# Patient Record
Sex: Female | Born: 1965 | Race: White | Hispanic: No | Marital: Married | Smoking: Never smoker
Health system: Southern US, Community
[De-identification: ages and names within clinical notes are randomized; demographics above are authoritative.]

## PROBLEM LIST (undated history)

## (undated) DIAGNOSIS — B2790 Infectious mononucleosis, unspecified without complication: Secondary | ICD-10-CM

## (undated) DIAGNOSIS — J302 Other seasonal allergic rhinitis: Secondary | ICD-10-CM

## (undated) DIAGNOSIS — B199 Unspecified viral hepatitis without hepatic coma: Secondary | ICD-10-CM

## (undated) DIAGNOSIS — E119 Type 2 diabetes mellitus without complications: Secondary | ICD-10-CM

## (undated) DIAGNOSIS — K219 Gastro-esophageal reflux disease without esophagitis: Secondary | ICD-10-CM

## (undated) DIAGNOSIS — D649 Anemia, unspecified: Secondary | ICD-10-CM

## (undated) DIAGNOSIS — F419 Anxiety disorder, unspecified: Secondary | ICD-10-CM

## (undated) DIAGNOSIS — T7840XA Allergy, unspecified, initial encounter: Secondary | ICD-10-CM

## (undated) DIAGNOSIS — G4730 Sleep apnea, unspecified: Secondary | ICD-10-CM

## (undated) DIAGNOSIS — M1990 Unspecified osteoarthritis, unspecified site: Secondary | ICD-10-CM

## (undated) HISTORY — DX: Anxiety disorder, unspecified: F41.9

## (undated) HISTORY — DX: Anemia, unspecified: D64.9

## (undated) HISTORY — PX: COLONOSCOPY: SHX174

## (undated) HISTORY — DX: Unspecified viral hepatitis without hepatic coma: B19.9

## (undated) HISTORY — DX: Allergy, unspecified, initial encounter: T78.40XA

## (undated) HISTORY — DX: Infectious mononucleosis, unspecified without complication: B27.90

## (undated) HISTORY — DX: Sleep apnea, unspecified: G47.30

## (undated) HISTORY — DX: Type 2 diabetes mellitus without complications: E11.9

---

## 1998-02-09 ENCOUNTER — Emergency Department (HOSPITAL_COMMUNITY): Admission: EM | Admit: 1998-02-09 | Discharge: 1998-02-10 | Payer: Self-pay | Admitting: Emergency Medicine

## 1999-01-27 ENCOUNTER — Encounter: Payer: Self-pay | Admitting: Obstetrics and Gynecology

## 1999-01-27 ENCOUNTER — Encounter: Admission: RE | Admit: 1999-01-27 | Discharge: 1999-01-27 | Payer: Self-pay | Admitting: Obstetrics and Gynecology

## 2001-08-17 ENCOUNTER — Ambulatory Visit (HOSPITAL_BASED_OUTPATIENT_CLINIC_OR_DEPARTMENT_OTHER): Admission: RE | Admit: 2001-08-17 | Discharge: 2001-08-17 | Payer: Self-pay | Admitting: Obstetrics and Gynecology

## 2002-03-15 HISTORY — PX: TUBAL LIGATION: SHX77

## 2004-06-09 ENCOUNTER — Ambulatory Visit (HOSPITAL_COMMUNITY): Admission: RE | Admit: 2004-06-09 | Discharge: 2004-06-09 | Payer: Self-pay | Admitting: Otolaryngology

## 2006-02-08 ENCOUNTER — Encounter: Admission: RE | Admit: 2006-02-08 | Discharge: 2006-02-08 | Payer: Self-pay | Admitting: Obstetrics and Gynecology

## 2007-05-02 ENCOUNTER — Encounter: Admission: RE | Admit: 2007-05-02 | Discharge: 2007-05-02 | Payer: Self-pay | Admitting: Obstetrics and Gynecology

## 2008-05-27 ENCOUNTER — Encounter: Admission: RE | Admit: 2008-05-27 | Discharge: 2008-05-27 | Payer: Self-pay | Admitting: Obstetrics and Gynecology

## 2008-06-07 ENCOUNTER — Encounter: Admission: RE | Admit: 2008-06-07 | Discharge: 2008-06-07 | Payer: Self-pay | Admitting: Obstetrics and Gynecology

## 2008-07-08 ENCOUNTER — Encounter (INDEPENDENT_AMBULATORY_CARE_PROVIDER_SITE_OTHER): Payer: Self-pay | Admitting: Obstetrics and Gynecology

## 2008-07-08 ENCOUNTER — Ambulatory Visit: Payer: Self-pay

## 2009-07-10 ENCOUNTER — Encounter: Admission: RE | Admit: 2009-07-10 | Discharge: 2009-07-10 | Payer: Self-pay | Admitting: Obstetrics and Gynecology

## 2010-07-31 NOTE — Op Note (Signed)
[facility]  Patient:    Mary [name], Mary [name] Visit Number: [id number] MRN: [id number]          Service Type: NES Location: [facility] Attending Physician:  Lendon Colonel Dictated by:   Kathie Rhodes. Kyra Manges, M.D. Proc. Date: 08/17/01 Admit Date:  08/17/2001                             Operative Report  PREOPERATIVE DIAGNOSIS:  Desires sterilization.  POSTOPERATIVE DIAGNOSIS:  Desires sterilization.  OPERATION:  Laparoscopic tubal ligation.  DESCRIPTION OF PROCEDURE:  The patient was placed in the lithotomy position and prepped and draped in the usual fashion.  A transverse incision was made in the umbilicus.  The patient was placed in the Trendelenburg position, and a Veress needle was inserted.  Aspiration and infusion.  I was not comfortable with the first placement.  Second placement with skin clips were utilized, and the Veress was inserted without difficulty.  Double click was heard. Aspiration infusion accomplished, and the gas was infused, about 3.2 liters of CO2.  The large trocar was inserted into the abdomen, and visualization of the pelvis revealed two normal tubes and ovaries.  The right tube was somewhat foreshortened, and there was a small adhesion over this tube.  This was lysed. The tubes were then cauterized 2 cm lateral to the uterotubal junction and scissored.  Hemostasis was secure.  No unusual abnormalities were noted in the liver.  No free fluid was noted within the abdomen, and the cul-de-sac was free from disease.  The gas was evacuated, and the skin was closed with a UR-6 needle and 0 Vicryl, following which I used 4-0 PDS to close the skin. Incision was infiltrated with 0.5% Marcaine with epinephrine, and the patient was taken to the recovery room in good condition. Dictated by:   S. Kyra Manges, M.D. Attending Physician:  Lendon Colonel DD:  08/17/01 TD:  08/18/01 Job: [id number]

## 2011-09-13 ENCOUNTER — Other Ambulatory Visit: Payer: Self-pay | Admitting: Obstetrics and Gynecology

## 2011-09-13 DIAGNOSIS — Z1231 Encounter for screening mammogram for malignant neoplasm of breast: Secondary | ICD-10-CM

## 2011-09-27 ENCOUNTER — Ambulatory Visit
Admission: RE | Admit: 2011-09-27 | Discharge: 2011-09-27 | Disposition: A | Discharge status: Home / Self Care | Payer: Private Health Insurance - Indemnity | Source: Ambulatory Visit | Attending: Obstetrics and Gynecology | Admitting: Obstetrics and Gynecology

## 2011-09-27 DIAGNOSIS — Z1231 Encounter for screening mammogram for malignant neoplasm of breast: Secondary | ICD-10-CM

## 2012-08-08 ENCOUNTER — Other Ambulatory Visit: Payer: Self-pay

## 2012-08-08 DIAGNOSIS — Z1231 Encounter for screening mammogram for malignant neoplasm of breast: Secondary | ICD-10-CM

## 2012-09-28 ENCOUNTER — Ambulatory Visit: Payer: Private Health Insurance - Indemnity

## 2012-10-26 ENCOUNTER — Ambulatory Visit: Payer: Private Health Insurance - Indemnity

## 2012-11-07 ENCOUNTER — Ambulatory Visit
Admission: RE | Admit: 2012-11-07 | Discharge: 2012-11-07 | Disposition: A | Discharge status: Home / Self Care | Payer: 59 | Source: Ambulatory Visit

## 2012-11-07 DIAGNOSIS — Z1231 Encounter for screening mammogram for malignant neoplasm of breast: Secondary | ICD-10-CM

## 2012-11-21 ENCOUNTER — Other Ambulatory Visit: Payer: Self-pay | Admitting: Radiology

## 2012-11-28 ENCOUNTER — Encounter (HOSPITAL_COMMUNITY): Payer: Self-pay

## 2012-11-28 ENCOUNTER — Encounter (HOSPITAL_COMMUNITY)
Admission: RE | Admit: 2012-11-28 | Discharge: 2012-11-28 | Disposition: A | Discharge status: Home / Self Care | Payer: 59 | Source: Ambulatory Visit | Attending: Orthopaedic Surgery | Admitting: Orthopaedic Surgery

## 2012-11-28 DIAGNOSIS — Z01812 Encounter for preprocedural laboratory examination: Secondary | ICD-10-CM | POA: Insufficient documentation

## 2012-11-28 HISTORY — DX: Gastro-esophageal reflux disease without esophagitis: K21.9

## 2012-11-28 HISTORY — DX: Unspecified osteoarthritis, unspecified site: M19.90

## 2012-11-28 HISTORY — DX: Other seasonal allergic rhinitis: J30.2

## 2012-11-28 LAB — CBC WITH DIFFERENTIAL/PLATELET
Basophils Relative: 0 % (ref 0–1)
Eosinophils Absolute: 0.1 10*3/uL (ref 0.0–0.7)
Lymphs Abs: 1.9 10*3/uL (ref 0.7–4.0)
MCH: 30.6 pg (ref 26.0–34.0)
Neutro Abs: 4.1 10*3/uL (ref 1.7–7.7)
Neutrophils Relative %: 61 % (ref 43–77)
Platelets: 279 10*3/uL (ref 150–400)
RBC: 4.35 MIL/uL (ref 3.87–5.11)

## 2012-11-28 LAB — URINALYSIS, ROUTINE W REFLEX MICROSCOPIC
Bilirubin Urine: NEGATIVE
Glucose, UA: NEGATIVE mg/dL
Ketones, ur: NEGATIVE mg/dL
pH: 7 (ref 5.0–8.0)

## 2012-11-28 LAB — COMPREHENSIVE METABOLIC PANEL
ALT: 32 U/L (ref 0–35)
Albumin: 3.7 g/dL (ref 3.5–5.2)
Alkaline Phosphatase: 73 U/L (ref 39–117)
Chloride: 106 mEq/L (ref 96–112)
Glucose, Bld: 97 mg/dL (ref 70–99)
Potassium: 4.4 mEq/L (ref 3.5–5.1)
Sodium: 142 mEq/L (ref 135–145)
Total Protein: 6.9 g/dL (ref 6.0–8.3)

## 2012-11-28 LAB — URINE MICROSCOPIC-ADD ON

## 2012-11-28 NOTE — Patient Instructions (Signed)
MATA ROWEN  11/28/2012   Your procedure is scheduled on:  12/05/12  Report to [facility] at [id number].  Call this number if you have problems the morning of surgery: [phone]   Remember:   Do not eat food or drink liquids after midnight.   Take these medicines the morning of surgery with A SIP OF WATER: none   Do not wear jewelry, make-up or nail polish.  Do not wear lotions, powders, or perfumes. You may wear deodorant.  Do not shave 48 hours prior to surgery. Men may shave face and neck.  Do not bring valuables to the hospital.  [facility] is not responsible                   for any belongings or valuables.  Contacts, dentures or bridgework may not be worn into surgery.  Leave suitcase in the car. After surgery it may be brought to your room.  For patients admitted to the hospital, checkout time is 11:00 AM the day of  discharge.   Patients discharged the day of surgery will not be allowed to drive  home.  Name and phone number of your driver: family  Special Instructions: Shower using CHG 2 nights before surgery and the night before surgery.  If you shower the day of surgery use CHG.  Use special wash - you have one bottle of CHG for all showers.  You should use approximately 1/3 of the bottle for each shower.   Please read over the following fact sheets that you were given: Pain Booklet, Surgical Site Infection Prevention, Anesthesia Post-op Instructions and Care and Recovery After Surgery   PATIENT INSTRUCTIONS POST-ANESTHESIA  IMMEDIATELY FOLLOWING SURGERY:  Do not drive or operate machinery for the first twenty four hours after surgery.  Do not make any important decisions for twenty four hours after surgery or while taking narcotic pain medications or sedatives.  If you develop intractable nausea and vomiting or a severe headache please notify your doctor immediately.  FOLLOW-UP:  Please make an appointment with your surgeon as instructed. You do not need to follow up with  anesthesia unless specifically instructed to do so.  WOUND CARE INSTRUCTIONS (if applicable):  Keep a dry clean dressing on the anesthesia/puncture wound site if there is drainage.  Once the wound has quit draining you may leave it open to air.  Generally you should leave the bandage intact for twenty four hours unless there is drainage.  If the epidural site drains for more than 36-48 hours please call the anesthesia department.  QUESTIONS?:  Please feel free to call your physician or the hospital operator if you have any questions, and they will be happy to assist you.      Carpal Tunnel Surgery The carpal tunnel is a narrow hollow area in the wrist. It is formed by the wrist bones and ligaments. Nerves, blood vessels, and tendons on the palm side of your hand pass through the carpal tunnel. (The palm side is the side of your hand in the direction your fingers bend.) Repeated wrist motion or certain diseases may cause swelling within the tunnel. That is why these are sometimes called repetitive trauma disorders. It is also a common problem in late pregnancy because of water retention. This swelling pinches the main nerve in the wrist (median nerve). It causes the painful condition called carpal tunnel syndrome. A feeling of "pins and needles" may be noticed in the fingers or hand. The entire arm  may ache from this condition. Carpal tunnel syndrome may clear up by itself. Cortisone injections may help. An electromyogram may be needed to confirm this diagnosis. This is a test which measures nerve conduction. The nerve conduction is usually slowed in a carpal tunnel syndrome. Sometimes, an operation may be needed to free the pinched nerve.  LET YOUR CAREGIVER KNOW ABOUT:  Allergies  Medications taken including herbs, eye drops, over the counter medications, and creams  Use of steroids (by mouth or creams)  Previous problems with anesthetics or novocaine  Possibility of pregnancy, if this  applies  History of blood clots (thrombophlebitis)  History of bleeding or blood problems  Previous surgery  Other health problems RISKS AND COMPLICATIONS  Infection: A germ starts growing in the wound. This can usually be treated with antibiotics.  Damage to other organs may occur.  Bleeding following surgery can be a complication of almost all surgeries. Your surgeon takes every precaution to keep this from happening.  Recurrence (return) of carpal tunnel syndrome following treatment is rare. BEFORE THE PROCEDURE  Stop smoking at least two weeks prior to surgery. This lowers risk during surgery.  Stop non steroidal medicine for ten days prior to surgery. Also, do not take aspirin unless OK'd by your surgeon.  Your caregiver may tell you to stop taking certain medicine that may affect the outcome of the surgery and your ability to heal. For example, you may need to stop taking anti-inflammatories, such as aspirin, because of possible bleeding problems. Other medicine may have interactions with anesthesia.  BE SURE TO LET YOUR CAREGIVER KNOW IF YOU HAVE BEEN ON STEROIDS (INCLUDING CREAMS) FOR LONG PERIODS OF TIME. THIS IS CRITICAL.  Your caregiver will discuss possible risks and complications with you before surgery. In addition to the usual risks of anesthesia, other common risks and complications include:  Temporary increase in pain due to surgery.  Uncorrected pain.  Infection. You should be present 60 minutes before your procedure or as directed.  PROCEDURE  Carpal tunnel release is generally recommended if symptoms last for 6 months. Surgery involves severing the band of tissue around the wrist to reduce pressure on the median nerve. Surgery is done under local anesthesia and does not require an overnight hospital stay. Many patients require surgery on both hands. The following are types of carpal tunnel release surgery:  Open release surgery, the traditional procedure  used to correct carpal tunnel syndrome, consists of making an incision up to 2 inches in the wrist and then cutting the carpal ligament to enlarge the carpal tunnel. The procedure is generally done under local anesthesia on an outpatient basis, unless there are unusual medical considerations.  Endoscopic surgery may allow faster functional recovery and less post-operative discomfort than traditional open release surgery. The surgeon makes two incisions (cuts) (about 1/2 inch each) in the wrist and palm, inserts a camera attached to a tube, looks at the tissue on a screen, and cuts the carpal ligament (the tissue that holds joints together). This two-portal endoscopic surgery, generally performed under local anesthesia, is effective and minimizes scarring and scar tenderness, if any. One-portal endoscopic surgery for carpal tunnel syndrome is also available. Although symptoms may be better right after surgery, full recovery from carpal tunnel surgery can take months. Some patients may have infection, nerve damage, stiffness, and pain at the scar. Sometimes the wrist loses strength because the carpal ligament is cut. Patients should take part in physical therapy after surgery to restore wrist strength. Some  patients may need to adjust job duties or even change jobs after recovery from surgery. The majority of patients recover completely without complications (additional problems). AFTER THE PROCEDURE After surgery, you will be taken to the recovery area where a nurse will watch and check your progress. Once you're awake, stable, and taking fluids well, without other problems you will be allowed to go home. HOME CARE INSTRUCTIONS   Once at home, an ice pack applied to your operative site may help with discomfort and keep the swelling down.  Follow your caregiver's instructions as to activities, exercises, physical therapy, and driving a car.  Maintain strength and range of motion as instructed.  If you  were given a splint to keep your wrist from bending, use it as instructed. It is important to wear the splint at night. Use the splint for as long as you have pain or numbness in your hand, arm or wrist. This may take 1 to 2 months.  If you have pain at night, it may help to elevate your hand above the level of your heart (the center of your chest).  It is important to avoid activities which originally caused your carpal tunnel syndrome for a couple weeks following surgery, or as directed by your surgeon. If your symptoms are work-related, you may need to talk to your employer about changing to a job that does not require using your wrist.  Only take over-the-counter or prescription medicines for pain, discomfort, or fever as directed by your caregiver.  Following periods of extended use, particularly hard (strenuous) use, apply an ice pack wrapped in a towel to the palm (anterior) side of the affected wrist for 20 to 30 minutes. Repeat as needed three to four times per day. This will help reduce swelling following surgery. SEEK MEDICAL CARE IF:   There is increased bleeding (more than a small spot) from the wound.  You notice redness, swelling, or increasing pain in the wound.  Pus is coming from wound.  An unexplained oral temperature above 102 F (38.9 C) develops.  You notice a foul smell coming from the wound or dressing. SEEK IMMEDIATE MEDICAL CARE IF:   You develop a rash.  You have difficulty breathing.  You have any problems you think are related to allergies. Document Released: 10/14/2003 Document Revised: 05/24/2011 Document Reviewed: 01/05/2007 [facility] Patient Information 2014 [place], [place]. Carpal Tunnel Syndrome The carpal tunnel is an area under the skin of the palm of your hand. Nerves, blood vessels, and strong tissues (tendons) pass through the tunnel. The tunnel can become puffy (swollen). If this happens, a nerve can be pinched in the wrist. This causes carpal  tunnel syndrome.  HOME CARE  Take all medicine as told by your doctor.  If you were given a splint, wear it as told. Wear it at night or at times when your doctor told you to.  Rest your wrist from the activity that causes your pain.  Put ice on your wrist after long periods of wrist activity.  Put ice in a plastic bag.  Place a towel between your skin and the bag.  Leave the ice on for 15-20 minutes, 3-4 times a day.  Keep all doctor visits as told. GET HELP RIGHT AWAY IF:  You have new problems you cannot explain.  Your problems get worse and medicine does not help. MAKE SURE YOU:   Understand these instructions.  Will watch your condition.  Will get help right away if you are not  doing well or get worse. Document Released: 02/18/2011 Document Revised: 05/24/2011 Document Reviewed: 02/18/2011 [facility] Patient Information 2014 [place], [place].

## 2012-12-04 NOTE — H&P (Signed)
Mary [name] is an 47 y.o. female.   Chief Complaint: Carpal Tunnel Syndrome HPI: She has had many months of pain in both hands and wrists with her fingers going to sleep at night.  It has gotten worse.  The left hand is worse than the right hand.  She has no trauma.  She has no redness or swelling.  EMGs show severe carpal tunnel syndrome on the left and moderate to severe on the right.  She has not improved with conservative treatment.  I have recommended carpal tunnel release.  I have explained the outpatient elective surgery and the risks and imponderables.  She asked appropriate questions.  She would like to have surgery on the left hand at this time.  Past Medical History  Diagnosis Date  . Seasonal allergies   . GERD (gastroesophageal reflux disease)   . Arthritis     Past Surgical History  Procedure Laterality Date  . Tubal ligation  2004    No family history on file. Social History:  reports that she has never smoked. She does not have any smokeless tobacco history on file. She reports that  drinks alcohol. She reports that she does not use illicit drugs.  Allergies: No Known Allergies  No prescriptions prior to admission    No results found for this or any previous visit (from the past 48 hour(s)). No results found.  Review of Systems  Musculoskeletal: Positive for joint pain (She has had noctural pain and tenderness in both hands, worse on the left for many months.  She has no trauma.).  All other systems reviewed and are negative.    There were no vitals taken for this visit. Physical Exam  Constitutional: She is oriented to person, place, and time. She appears well-developed and well-nourished.  HENT:  Head: Normocephalic.  Eyes: Pupils are equal, round, and reactive to light.  Neck: Normal range of motion. Neck supple.  Cardiovascular: Normal rate, regular rhythm, normal heart sounds and intact distal pulses.   Respiratory: Effort normal and breath sounds  normal.  Musculoskeletal: She exhibits tenderness (She has pain and tenderness of both wrists with positive Phalen and Tinel signs, worse on the left than the right with decreased sensation of the median nerve bilaterally.).  Neurological: She is alert and oriented to person, place, and time. She has normal reflexes.  Skin: Skin is warm and dry.  Psychiatric: She has a normal mood and affect. Her behavior is normal. Judgment normal.     Assessment/Plan Carpal tunnel syndrome, worse on the left than the right.  For release of the left carpal tunnel as outpatient.  Tiannah Greenly 12/04/2012, 1:57 PM

## 2012-12-05 ENCOUNTER — Encounter (HOSPITAL_COMMUNITY): Payer: Self-pay | Admitting: Anesthesiology

## 2012-12-05 ENCOUNTER — Ambulatory Visit (HOSPITAL_COMMUNITY): Payer: 59 | Admitting: Anesthesiology

## 2012-12-05 ENCOUNTER — Encounter (HOSPITAL_COMMUNITY): Payer: Self-pay | Admitting: *Deleted

## 2012-12-05 ENCOUNTER — Ambulatory Visit (HOSPITAL_COMMUNITY)
Admission: RE | Admit: 2012-12-05 | Discharge: 2012-12-05 | Disposition: A | Discharge status: Home / Self Care | Payer: 59 | Source: Ambulatory Visit | Attending: Orthopaedic Surgery | Admitting: Orthopaedic Surgery

## 2012-12-05 ENCOUNTER — Encounter (HOSPITAL_COMMUNITY)
Admission: RE | Disposition: A | Discharge status: Home / Self Care | Payer: Self-pay | Source: Ambulatory Visit | Attending: Orthopaedic Surgery

## 2012-12-05 DIAGNOSIS — G5600 Carpal tunnel syndrome, unspecified upper limb: Secondary | ICD-10-CM | POA: Insufficient documentation

## 2012-12-05 HISTORY — PX: CARPAL TUNNEL RELEASE: SHX101

## 2012-12-05 SURGERY — CARPAL TUNNEL RELEASE
Anesthesia: Monitor Anesthesia Care | Site: Wrist | Laterality: Left | Wound class: Clean

## 2012-12-05 MED ORDER — LACTATED RINGERS IV SOLN
INTRAVENOUS | Status: DC
  Administered 2012-12-05: 07:00:00 via INTRAVENOUS

## 2012-12-05 MED ORDER — PROPOFOL 10 MG/ML IV EMUL
INTRAVENOUS | Status: AC
  Filled 2012-12-05: qty 20

## 2012-12-05 MED ORDER — LIDOCAINE HCL (PF) 0.5 % IJ SOLN
INTRAMUSCULAR | Status: AC
  Filled 2012-12-05: qty 50

## 2012-12-05 MED ORDER — FENTANYL CITRATE 0.05 MG/ML IJ SOLN
25.0000 ug | INTRAMUSCULAR | Status: DC | PRN
  Administered 2012-12-05 (×2): 50 ug via INTRAVENOUS

## 2012-12-05 MED ORDER — HYDROCODONE-ACETAMINOPHEN 7.5-325 MG PO TABS: 1.0000 | ORAL_TABLET | ORAL | Status: DC | PRN

## 2012-12-05 MED ORDER — ONDANSETRON HCL 4 MG/2ML IJ SOLN: 4.0000 mg | Freq: Once | INTRAMUSCULAR | Status: DC | PRN

## 2012-12-05 MED ORDER — PROPOFOL INFUSION 10 MG/ML OPTIME
INTRAVENOUS | Status: DC | PRN
  Administered 2012-12-05: 40 ug/kg/min via INTRAVENOUS

## 2012-12-05 MED ORDER — LIDOCAINE HCL (PF) 1 % IJ SOLN
INTRAMUSCULAR | Status: AC
  Filled 2012-12-05: qty 5

## 2012-12-05 MED ORDER — MIDAZOLAM HCL 2 MG/2ML IJ SOLN
INTRAMUSCULAR | Status: AC
  Filled 2012-12-05: qty 2

## 2012-12-05 MED ORDER — SODIUM CHLORIDE 0.9 % IR SOLN
Status: DC | PRN
  Administered 2012-12-05: 1000 mL

## 2012-12-05 MED ORDER — CHLORHEXIDINE GLUCONATE 4 % EX LIQD
60.0000 mL | Freq: Once | CUTANEOUS | Status: DC
Start: 2012-12-05 — End: 2012-12-05

## 2012-12-05 MED ORDER — SODIUM CHLORIDE 0.9 % IJ SOLN
INTRAMUSCULAR | Status: DC | PRN
  Administered 2012-12-05: 1 mL via INTRAVENOUS

## 2012-12-05 MED ORDER — FENTANYL CITRATE 0.05 MG/ML IJ SOLN
INTRAMUSCULAR | Status: AC
  Filled 2012-12-05: qty 2

## 2012-12-05 MED ORDER — FENTANYL CITRATE 0.05 MG/ML IJ SOLN
INTRAMUSCULAR | Status: DC | PRN
  Administered 2012-12-05 (×3): 25 ug via INTRAVENOUS

## 2012-12-05 MED ORDER — LIDOCAINE HCL (PF) 0.5 % IJ SOLN
INTRAMUSCULAR | Status: DC | PRN
  Administered 2012-12-05: 50 mL via INTRAVENOUS

## 2012-12-05 MED ORDER — FENTANYL CITRATE 0.05 MG/ML IJ SOLN
25.0000 ug | INTRAMUSCULAR | Status: AC
  Administered 2012-12-05: 25 ug via INTRAVENOUS

## 2012-12-05 MED ORDER — MIDAZOLAM HCL 5 MG/5ML IJ SOLN
INTRAMUSCULAR | Status: DC | PRN
  Administered 2012-12-05: 1 mg via INTRAVENOUS

## 2012-12-05 MED ORDER — MIDAZOLAM HCL 2 MG/2ML IJ SOLN
1.0000 mg | INTRAMUSCULAR | Status: DC | PRN
  Administered 2012-12-05: 2 mg via INTRAVENOUS

## 2012-12-05 SURGICAL SUPPLY — 41 items
BAG HAMPER (MISCELLANEOUS) ×2 IMPLANT
BANDAGE ELASTIC 3 VELCRO NS (GAUZE/BANDAGES/DRESSINGS) ×2 IMPLANT
BANDAGE ESMARK 4X12 BL STRL LF (DISPOSABLE) ×1 IMPLANT
BLADE SURG 15 STRL LF DISP TIS (BLADE) ×1 IMPLANT
BLADE SURG 15 STRL SS (BLADE) ×2
BNDG CMPR 12X4 ELC STRL LF (DISPOSABLE) ×1
BNDG ESMARK 4X12 BLUE STRL LF (DISPOSABLE) ×2
CLOTH BEACON ORANGE TIMEOUT ST (SAFETY) ×2 IMPLANT
COVER LIGHT HANDLE STERIS (MISCELLANEOUS) ×4 IMPLANT
CUFF TOURNIQUET SINGLE 18IN (TOURNIQUET CUFF) ×2 IMPLANT
DRSG XEROFORM 1X8 (GAUZE/BANDAGES/DRESSINGS) ×1 IMPLANT
DURAPREP 26ML APPLICATOR (WOUND CARE) ×2 IMPLANT
ELECT NDL TIP 2.8 STRL (NEEDLE) IMPLANT
ELECT NEEDLE TIP 2.8 STRL (NEEDLE) ×2 IMPLANT
ELECT REM PT RETURN 9FT ADLT (ELECTROSURGICAL) ×2
ELECTRODE REM PT RTRN 9FT ADLT (ELECTROSURGICAL) ×1 IMPLANT
FORMALIN 10 PREFIL 120ML (MISCELLANEOUS) ×2 IMPLANT
GLOVE BIO SURGEON STRL SZ8 (GLOVE) ×2 IMPLANT
GLOVE BIO SURGEON STRL SZ8.5 (GLOVE) ×2 IMPLANT
GLOVE BIOGEL PI IND STRL 7.0 (GLOVE) ×2 IMPLANT
GLOVE BIOGEL PI INDICATOR 7.0 (GLOVE) ×2
GLOVE ECLIPSE 6.5 STRL STRAW (GLOVE) ×1 IMPLANT
GLOVE EXAM NITRILE LRG STRL (GLOVE) ×1 IMPLANT
GOWN STRL REIN XL XLG (GOWN DISPOSABLE) ×6 IMPLANT
KIT ROOM TURNOVER APOR (KITS) ×2 IMPLANT
NDL HYPO 18GX1.5 BLUNT FILL (NEEDLE) ×1 IMPLANT
NDL HYPO 27GX1-1/4 (NEEDLE) ×1 IMPLANT
NEEDLE HYPO 18GX1.5 BLUNT FILL (NEEDLE) ×2 IMPLANT
NEEDLE HYPO 27GX1-1/4 (NEEDLE) ×2 IMPLANT
NS IRRIG 1000ML POUR BTL (IV SOLUTION) ×2 IMPLANT
PACK BASIC LIMB (CUSTOM PROCEDURE TRAY) ×2 IMPLANT
PAD ARMBOARD 7.5X6 YLW CONV (MISCELLANEOUS) ×2 IMPLANT
PAD CAST 3X4 CTTN HI CHSV (CAST SUPPLIES) ×1 IMPLANT
PADDING CAST COTTON 3X4 STRL (CAST SUPPLIES) ×2
PADDING WEBRIL 3 STERILE (GAUZE/BANDAGES/DRESSINGS) ×1 IMPLANT
SET BASIN LINEN APH (SET/KITS/TRAYS/PACK) ×2 IMPLANT
SPONGE GAUZE 4X4 12PLY (GAUZE/BANDAGES/DRESSINGS) ×2 IMPLANT
SUT ETHILON 3 0 FSL (SUTURE) ×2 IMPLANT
SYR 3ML LL SCALE MARK (SYRINGE) ×2 IMPLANT
TOWEL OR 17X26 4PK STRL BLUE (TOWEL DISPOSABLE) ×2 IMPLANT
VESSEL LOOPS MAXI RED (MISCELLANEOUS) ×2 IMPLANT

## 2012-12-05 NOTE — Anesthesia Procedure Notes (Signed)
Anesthesia Regional Block:  Bier block ([facility])  Pre-Anesthetic Checklist: ,, timeout performed, Correct Patient, Correct Site, Correct Laterality, Correct Procedure,, site marked, surgical consent,, at surgeon's request Needles:  Injection technique: Single-shot  Needle Type: Other      Needle Gauge: 20 and 20 G    Additional Needles: Bier block ([facility] Regional)  Nerve Stimulator or Paresthesia:   Additional Responses:  Pulse checked post tourniquet inflation. IV NSL discontinued post injection. Narrative:  Start time: 12/05/2012 7:49 AM  Performed by: Personally   Bier block ([facility])

## 2012-12-05 NOTE — Transfer of Care (Signed)
Immediate Anesthesia Transfer of Care Note  Patient: Mary [name]  Procedure(s) Performed: Procedure(s): CARPAL TUNNEL RELEASE (Left)  Patient Location: PACU  Anesthesia Type:Bier block  Level of Consciousness: awake, alert  and oriented  Airway & Oxygen Therapy: Patient Spontanous Breathing  Post-op Assessment: Report given to PACU RN  Post vital signs: Reviewed and stable  Complications: No apparent anesthesia complications

## 2012-12-05 NOTE — Anesthesia Preprocedure Evaluation (Signed)
Anesthesia Evaluation  Patient identified by MRN, date of birth, ID band Patient awake    Reviewed: Allergy & Precautions, H&P , NPO status , Patient's Chart, lab work & pertinent test results  Airway Mallampati: I TM Distance: >3 FB Neck ROM: Full    Dental  (+) Teeth Intact   Pulmonary neg pulmonary ROS,  breath sounds clear to auscultation        Cardiovascular negative cardio ROS  Rhythm:Regular Rate:Normal     Neuro/Psych    GI/Hepatic GERD-  Medicated and Controlled,  Endo/Other    Renal/GU      Musculoskeletal   Abdominal   Peds  Hematology   Anesthesia Other Findings   Reproductive/Obstetrics                           Anesthesia Physical Anesthesia Plan  ASA: II  Anesthesia Plan: Bier Block and MAC   Post-op Pain Management:    Induction: Intravenous  Airway Management Planned: Nasal Cannula  Additional Equipment:   Intra-op Plan:   Post-operative Plan:   Informed Consent: I have reviewed the patients History and Physical, chart, labs and discussed the procedure including the risks, benefits and alternatives for the proposed anesthesia with the patient or authorized representative who has indicated his/her understanding and acceptance.     Plan Discussed with:   Anesthesia Plan Comments:         Anesthesia Quick Evaluation

## 2012-12-05 NOTE — Brief Op Note (Signed)
12/05/2012  8:22 AM  PATIENT:  Mary [name]  47 y.o. female  PRE-OPERATIVE DIAGNOSIS:  carpal tunnel syndrome left wrist  POST-OPERATIVE DIAGNOSIS:  carpal tunnel syndrome left wrist  PROCEDURE:  Procedure(s): CARPAL TUNNEL RELEASE (Left)  SURGEON:  Surgeon(s) and Role:    * Darreld Mclean, MD - Primary  PHYSICIAN ASSISTANT:   ASSISTANTS: none   ANESTHESIA:   regional  EBL:  Total I/O In: 200 [I.V.:200] Out: 0   BLOOD ADMINISTERED:none  DRAINS: none   LOCAL MEDICATIONS USED:  NONE  SPECIMEN:  Source of Specimen:  left volar carpal ligament  DISPOSITION OF SPECIMEN:  PATHOLOGY  COUNTS:  YES  TOURNIQUET:  * Missing tourniquet times found for documented tourniquets in log:  [id number] *  DICTATION: .Other Dictation: Dictation Number [id number]  PLAN OF CARE: Discharge to home after PACU  PATIENT DISPOSITION:  PACU - hemodynamically stable.   Delay start of Pharmacological VTE agent (>24hrs) due to surgical blood loss or risk of bleeding: not applicable

## 2012-12-05 NOTE — Progress Notes (Signed)
The History and Physical is unchanged. I have examined the patient. The patient is medically able to have surgery on the left hand . Mary [name] 

## 2012-12-05 NOTE — Anesthesia Postprocedure Evaluation (Signed)
  Anesthesia Post-op Note  Patient: Mary [name]  Procedure(s) Performed: Procedure(s): CARPAL TUNNEL RELEASE (Left)  Patient Location: PACU  Anesthesia Type:Bier block  Level of Consciousness: awake, alert  and oriented  Airway and Oxygen Therapy: Patient Spontanous Breathing  Post-op Pain: none  Post-op Assessment: Post-op Vital signs reviewed, Patient's Cardiovascular Status Stable, Respiratory Function Stable, Patent Airway and No signs of Nausea or vomiting  Post-op Vital Signs: Reviewed and stable 104/74, 73, 17, SpO2 99, 36.7  Complications: No apparent anesthesia complications

## 2012-12-06 ENCOUNTER — Encounter (HOSPITAL_COMMUNITY): Payer: Self-pay | Admitting: Orthopaedic Surgery

## 2012-12-06 NOTE — Op Note (Signed)
[name], [name]               ACCOUNT NO.:  [id number]  MEDICAL RECORD NO.:  [id number]  LOCATION:  [facility]                          FACILITY:  [facility]  PHYSICIAN:  J. Darreld Mclean, M.D. DATE OF BIRTH:  1965/09/23  DATE OF PROCEDURE:  12/05/2012 DATE OF DISCHARGE:                              OPERATIVE REPORT   PREOPERATIVE DIAGNOSIS:  Carpal tunnel syndrome, left.  POSTOPERATIVE DIAGNOSIS:  Carpal tunnel syndrome, left.  PROCEDURE:  Incision of volar carpal ligament, saline neurolysis, epineurotomy, left median nerve.  ANESTHESIA:  Regional Bier block.  Please refer to anesthesia record for tourniquet time.  SURGEON:  J. Darreld Mclean, M.D.  INDICATIONS:  The patient has a several month history of pain and decreased sensation in the median nerve distribution of both hands, particularly on the left.  She has not improved with conservative treatment.  She had EMG showing severe carpal tunnel syndrome on the left and moderate-to-severe on the right.  I have discussed with her treatment options.  She has elected to proceed with carpal tunnel release.  I went over the risks and imponderables with her prior to the procedure.  She appeared to understand and agreed with the procedure. She understands and elected to proceed.  DESCRIPTION OF PROCEDURE:  The patient was seen in the holding area and the left wrist was identified as a correct surgical site.  I placed a mark over the area.  She was brought to the operating room and placed supine on the operating room table.  She is given Bier block anesthesia. She was then prepped and draped in the usual manner.  We had a generalized time-out identifying the patient as Mary [name]. We are doing a left wrist for a carpal tunnel release on the volar side. All instrumentation was properly positioned and working.  The OR team knew each other.  Outline for incision was made with careful dissection.  The median nerve was identified  proximally.  Vessel loop placed around the nerve.  A groove director was then placed within the carpal tunnel space and the volar carpal ligament was incised.  Specimen of volar carpal ligament was sent to Pathology.  Retinaculum cut proximally.  Saline neurolysis and epineurotomy carried out at the median nerve.  The nerve was inspected, no apparent injury, and the wound was reapproximated with 3-0 nylon interrupted vertical mattress manner.  Sterile dressing applied and sheet cotton applied loosely.  Sheet cotton cut dorsally.  ACE bandage was applied loosely.  The patient tolerated the procedure well, go to recovery in good condition. Appropriate analgesia to be given for pain.  I will see her in the office in 1 week.  If any difficulties, contact me through the office hospital beeper system.  Numbers have been provided.          ______________________________ Shela Commons. Darreld Mclean, M.D.     JWK/MEDQ  D:  12/05/2012  T:  12/05/2012  Job:  [id number]

## 2013-07-19 ENCOUNTER — Other Ambulatory Visit (HOSPITAL_COMMUNITY)
Admission: RE | Admit: 2013-07-19 | Discharge: 2013-07-19 | Disposition: A | Discharge status: Home / Self Care | Payer: 59 | Source: Ambulatory Visit | Attending: Orthopaedic Surgery | Admitting: Orthopaedic Surgery

## 2013-07-19 DIAGNOSIS — M6530 Trigger finger, unspecified finger: Secondary | ICD-10-CM | POA: Diagnosis not present

## 2013-11-01 ENCOUNTER — Other Ambulatory Visit: Payer: Self-pay

## 2013-11-01 DIAGNOSIS — Z1231 Encounter for screening mammogram for malignant neoplasm of breast: Secondary | ICD-10-CM

## 2013-12-03 ENCOUNTER — Ambulatory Visit
Admission: RE | Admit: 2013-12-03 | Discharge: 2013-12-03 | Disposition: A | Discharge status: Home / Self Care | Payer: 59 | Source: Ambulatory Visit

## 2013-12-03 ENCOUNTER — Encounter (INDEPENDENT_AMBULATORY_CARE_PROVIDER_SITE_OTHER): Payer: Self-pay

## 2013-12-03 DIAGNOSIS — Z1231 Encounter for screening mammogram for malignant neoplasm of breast: Secondary | ICD-10-CM

## 2014-01-29 ENCOUNTER — Other Ambulatory Visit: Payer: Self-pay | Admitting: Obstetrics and Gynecology

## 2014-01-31 LAB — CYTOLOGY - PAP

## 2014-06-17 ENCOUNTER — Encounter: Payer: Self-pay | Admitting: Nurse Practitioner

## 2014-06-17 ENCOUNTER — Ambulatory Visit (INDEPENDENT_AMBULATORY_CARE_PROVIDER_SITE_OTHER): Payer: 59 | Admitting: Nurse Practitioner

## 2014-06-17 VITALS — BP 121/79 | HR 71 | Temp 98.1°F | Ht 64.0 in | Wt 257.0 lb

## 2014-06-17 DIAGNOSIS — J069 Acute upper respiratory infection, unspecified: Secondary | ICD-10-CM | POA: Diagnosis not present

## 2014-06-17 MED ORDER — METHYLPREDNISOLONE ACETATE 80 MG/ML IJ SUSP
80.0000 mg | Freq: Once | INTRAMUSCULAR | Status: AC
  Administered 2014-06-17: 80 mg via INTRAMUSCULAR

## 2014-06-17 NOTE — Progress Notes (Signed)
  Subjective:     Mary [name] is a 49 y.o. female who presents for evaluation of sore throat. Associated symptoms include dry cough, headache, hoarseness, nasal blockage, post nasal drip and sinus and nasal congestion. Onset of symptoms was 2 weeks ago, and have been some of they symptoms got better, but still hoarse since that time. She is drinking plenty of fluids. She has not had a recent close exposure to someone with proven streptococcal pharyngitis. [facility] and was given Augmentin and tessalon pearls.   The following portions of the patient's history were reviewed and updated as appropriate: allergies, current medications, past family history, past medical history, past social history, past surgical history and problem list.  Review of Systems Pertinent items are noted in HPI.    Objective:    BP 121/79 mmHg  Pulse 71  Temp(Src) 98.1 F (36.7 C) (Oral)  Ht 5\' 4"  (1.626 m)  Wt 257 lb (116.574 kg)  BMI 44.09 kg/m2 General appearance: alert, cooperative and appears stated age Head: Normocephalic, without obvious abnormality, atraumatic Eyes: conjunctivae/corneas clear. PERRL, EOM's intact. Fundi benign. Ears: normal TM's and external ear canals both ears Nose: Nares normal. Septum midline. Mucosa normal. No drainage or sinus tenderness. Throat: lips, mucosa, and tongue normal; teeth and gums normal Lungs: clear to auscultation bilaterally Heart: regular rate and rhythm, S1, S2 normal, no murmur, click, rub or gallop  Laboratory None today   Assessment:   URI     Plan:   Meds ordered this encounter  Medications  . escitalopram (LEXAPRO) 10 MG tablet    Sig: Take 10 mg by mouth daily.  . methylPREDNISolone acetate (DEPO-MEDROL) injection 80 mg    Sig:    1. Take meds as prescribed 2. Use a cool mist humidifier especially during the winter months and when heat has been humid. 3. Use saline nose sprays frequently 4. Saline irrigations of the  nose can be very helpful if done frequently.  * 4X daily for 1 week*  * Use of a nettie pot can be helpful with this. Follow directions with this* 5. Drink plenty of fluids 6. Keep thermostat turn down low 7.For any cough or congestion  Use plain Mucinex- regular strength or max strength is fine   * Children- consult with Pharmacist for dosing 8. For fever or aces or pains- take tylenol or ibuprofen appropriate for age and weight.  * for fevers greater than 101 orally you may alternate ibuprofen and tylenol every  3 hours.    RTO prn  Mary-Margaret Hassell Done, FNP

## 2014-06-17 NOTE — Patient Instructions (Signed)

## 2014-11-05 ENCOUNTER — Other Ambulatory Visit: Payer: Self-pay

## 2014-11-05 DIAGNOSIS — Z1231 Encounter for screening mammogram for malignant neoplasm of breast: Secondary | ICD-10-CM

## 2014-12-06 ENCOUNTER — Ambulatory Visit: Payer: Self-pay

## 2014-12-18 ENCOUNTER — Ambulatory Visit
Admission: RE | Admit: 2014-12-18 | Discharge: 2014-12-18 | Disposition: A | Discharge status: Home / Self Care | Payer: 59 | Source: Ambulatory Visit

## 2014-12-18 DIAGNOSIS — Z1231 Encounter for screening mammogram for malignant neoplasm of breast: Secondary | ICD-10-CM

## 2014-12-19 ENCOUNTER — Other Ambulatory Visit: Payer: Self-pay | Admitting: Obstetrics and Gynecology

## 2014-12-19 DIAGNOSIS — R928 Other abnormal and inconclusive findings on diagnostic imaging of breast: Secondary | ICD-10-CM

## 2014-12-26 ENCOUNTER — Ambulatory Visit
Admission: RE | Admit: 2014-12-26 | Discharge: 2014-12-26 | Disposition: A | Discharge status: Home / Self Care | Payer: 59 | Source: Ambulatory Visit | Attending: Obstetrics and Gynecology | Admitting: Obstetrics and Gynecology

## 2014-12-26 ENCOUNTER — Other Ambulatory Visit: Payer: Self-pay | Admitting: Obstetrics and Gynecology

## 2014-12-26 DIAGNOSIS — R928 Other abnormal and inconclusive findings on diagnostic imaging of breast: Secondary | ICD-10-CM

## 2015-12-08 ENCOUNTER — Ambulatory Visit (INDEPENDENT_AMBULATORY_CARE_PROVIDER_SITE_OTHER): Payer: 59 | Admitting: Nurse Practitioner

## 2015-12-08 ENCOUNTER — Ambulatory Visit (INDEPENDENT_AMBULATORY_CARE_PROVIDER_SITE_OTHER): Payer: 59

## 2015-12-08 ENCOUNTER — Other Ambulatory Visit: Payer: Self-pay | Admitting: Obstetrics and Gynecology

## 2015-12-08 ENCOUNTER — Encounter: Payer: Self-pay | Admitting: Nurse Practitioner

## 2015-12-08 VITALS — BP 127/93 | HR 78 | Temp 97.2°F | Ht 64.0 in | Wt 265.0 lb

## 2015-12-08 DIAGNOSIS — M25561 Pain in right knee: Secondary | ICD-10-CM

## 2015-12-08 DIAGNOSIS — Z1231 Encounter for screening mammogram for malignant neoplasm of breast: Secondary | ICD-10-CM

## 2015-12-08 MED ORDER — NAPROXEN 500 MG PO TABS: 500.0000 mg | ORAL_TABLET | Freq: Two times a day (BID) | ORAL | 1 refills | Status: DC

## 2015-12-08 NOTE — Patient Instructions (Signed)
    Elastic Bandage and RICE WHAT DOES AN ELASTIC BANDAGE DO? Elastic bandages come in different shapes and sizes. They generally provide support to your injury and reduce swelling while you are healing, but they can perform different functions. Your health care provider will help you to decide what is best for your protection, recovery, or rehabilitation following an injury. WHAT ARE SOME GENERAL TIPS FOR USING AN ELASTIC BANDAGE?  Use the bandage as directed by the maker of the bandage that you are using.  Do not wrap the bandage too tightly. This may cut off the circulation in the arm or leg in the area below the bandage.  If part of your body beyond the bandage becomes blue, numb, cold, swollen, or is more painful, your bandage is most likely too tight. If this occurs, remove your bandage and reapply it more loosely.  See your health care provider if the bandage seems to be making your problems worse rather than better.  An elastic bandage should be removed and reapplied every 3-4 hours or as directed by your health care provider. WHAT IS RICE? The routine care of many injuries includes rest, ice, compression, and elevation (RICE therapy).  Rest Rest is required to allow your body to heal. Generally, you can resume your routine activities when you are comfortable and have been given permission by your health care provider. Ice Icing your injury helps to keep the swelling down and it reduces pain. Do not apply ice directly to your skin.  Put ice in a plastic bag.  Place a towel between your skin and the bag.  Leave the ice on for 20 minutes, 2-3 times per day. Do this for as long as you are directed by your health care provider. Compression Compression helps to keep swelling down, gives support, and helps with discomfort. Compression may be done with an elastic bandage. Elevation Elevation helps to reduce swelling and it decreases pain. If possible, your injured area should be  placed at or above the level of your heart or the center of your chest. [place]? You should seek medical care if:  You have persistent pain and swelling.  Your symptoms are getting worse rather than improving. These symptoms may indicate that further evaluation or further X-rays are needed. Sometimes, X-rays may not show a small broken bone (fracture) until a number of days later. Make a follow-up appointment with your health care provider. Ask when your X-ray results will be ready. Make sure that you get your X-ray results. WHEN SHOULD I SEEK IMMEDIATE MEDICAL CARE? You should seek immediate medical care if:  You have a sudden onset of severe pain at or below the area of your injury.  You develop redness or increased swelling around your injury.  You have tingling or numbness at or below the area of your injury that does not improve after you remove the elastic bandage.   This information is not intended to replace advice given to you by your health care provider. Make sure you discuss any questions you have with your health care provider.   Document Released: 08/21/2001 Document Revised: 11/20/2014 Document Reviewed: 10/15/2013 Elsevier Interactive Patient Education [employer].

## 2015-12-08 NOTE — Progress Notes (Signed)
   Subjective:    Patient ID: Mary [name], female    DOB: 07/08/1965, 50 y.o.   MRN: [id number]  Knee Pain   The incident occurred 5 to 7 days ago. The incident occurred at home. There was no injury mechanism. The pain is present in the right knee. Quality: dull ache. The pain is at a severity of 4/10. The pain is mild. The pain has been intermittent since onset. Pertinent negatives include no inability to bear weight, loss of motion, loss of sensation, muscle weakness, numbness or tingling. She reports no foreign bodies present. Exacerbated by: bending the knee or going up the stairs. She has tried NSAIDs, ice and rest for the symptoms. The treatment provided moderate relief.      Review of Systems  Constitutional: Negative.   Respiratory: Negative.   Cardiovascular: Negative.   Genitourinary: Negative for flank pain.  Neurological: Negative for tingling and numbness.       Objective:   Physical Exam  Constitutional: She appears well-developed and well-nourished.  Cardiovascular: Normal rate and regular rhythm.   Musculoskeletal: Normal range of motion. She exhibits no edema or deformity. Tenderness: mild tenderness and edema noted on palptation of the right knee. no redness.  Cepitus noted. Negative drawers testn.   BP (!) 127/93   Pulse 78   Temp 97.2 F (36.2 C) (Oral)   Ht 5\' 4"  (1.626 m)   Wt 265 lb (120.2 kg)   BMI 45.49 kg/m    DG Knee 1-2 views Right- no fracture, no abnormality noted. Preliminary  Reading by Jari Favre, RN, NP-student    Assessment & Plan:  1. Right knee pain RICE as needed No Gym for 2 weeks - DG Knee 1-2 Views Right; Future - naproxen (NAPROSYN) 500 MG tablet; Take 1 tablet (500 mg total) by mouth 2 (two) times daily with a meal.  Dispense: 60 tablet; Refill: 1  RTO as needed if symptom worsens or does not improve in 2 weeks. Mary-Margaret Hassell Done, FNP

## 2015-12-19 ENCOUNTER — Ambulatory Visit
Admission: RE | Admit: 2015-12-19 | Discharge: 2015-12-19 | Disposition: A | Discharge status: Home / Self Care | Payer: 59 | Source: Ambulatory Visit | Attending: Obstetrics and Gynecology | Admitting: Obstetrics and Gynecology

## 2015-12-19 DIAGNOSIS — Z1231 Encounter for screening mammogram for malignant neoplasm of breast: Secondary | ICD-10-CM

## 2016-01-19 ENCOUNTER — Encounter: Payer: Self-pay | Admitting: Family

## 2016-01-19 ENCOUNTER — Ambulatory Visit (INDEPENDENT_AMBULATORY_CARE_PROVIDER_SITE_OTHER): Payer: 59 | Admitting: Family

## 2016-01-19 VITALS — BP 119/79 | HR 75 | Ht 64.0 in | Wt 262.0 lb

## 2016-01-19 DIAGNOSIS — R590 Localized enlarged lymph nodes: Secondary | ICD-10-CM | POA: Diagnosis not present

## 2016-01-19 NOTE — Progress Notes (Signed)
   Subjective:    Patient ID: Mary [name], female    DOB: 1965/09/23, 50 y.o.   MRN: [id number]  HPI PT presents to the office today with a "swollen knot" under her right chin. PT states she noticed it Thursday and has become less swollen and less tender. PT states it is tender and is 2 out 10. She states she has been taking amoxicillin since Friday. PT had a "crown" placed on her right lower tooth about 3 weeks ago.    Review of Systems  HENT: Positive for facial swelling.   All other systems reviewed and are negative.      Objective:   Physical Exam  Constitutional: She is oriented to person, place, and time. She appears well-developed and well-nourished. No distress.  HENT:  Head: Normocephalic.  Neck: Normal range of motion. Neck supple. No thyromegaly present.  Cardiovascular: Normal rate, regular rhythm, normal heart sounds and intact distal pulses.   No murmur heard. Pulmonary/Chest: Effort normal and breath sounds normal. No respiratory distress. She has no wheezes.  Abdominal: Soft. Bowel sounds are normal. She exhibits no distension. There is no tenderness.  Musculoskeletal: Normal range of motion. She exhibits no edema or tenderness.  Lymphadenopathy:    She has cervical adenopathy (small right movable submandibular lymph node).  Neurological: She is alert and oriented to person, place, and time.  Skin: Skin is warm and dry.  Psychiatric: She has a normal mood and affect. Her behavior is normal. Judgment and thought content normal.  Vitals reviewed.     BP 119/79   Pulse 75   Ht 5\' 4"  (1.626 m)   Wt 262 lb (118.8 kg)   BMI 44.97 kg/m      Assessment & Plan:  1. Submandibular lymphadenopathy -Complete amoxicillin from dentist  -Force fluids -Continue watch, if increases or does not improve and return and we will do lab work -RTO prn   Evelina Dun, FNP

## 2016-01-19 NOTE — Patient Instructions (Signed)

## 2016-04-15 DIAGNOSIS — Z01419 Encounter for gynecological examination (general) (routine) without abnormal findings: Secondary | ICD-10-CM | POA: Diagnosis not present

## 2016-09-21 ENCOUNTER — Other Ambulatory Visit: Payer: Self-pay | Admitting: Dermatology

## 2016-09-21 DIAGNOSIS — C44519 Basal cell carcinoma of skin of other part of trunk: Secondary | ICD-10-CM | POA: Diagnosis not present

## 2016-09-21 DIAGNOSIS — C44612 Basal cell carcinoma of skin of right upper limb, including shoulder: Secondary | ICD-10-CM | POA: Diagnosis not present

## 2016-09-21 DIAGNOSIS — L821 Other seborrheic keratosis: Secondary | ICD-10-CM | POA: Diagnosis not present

## 2016-09-21 DIAGNOSIS — D225 Melanocytic nevi of trunk: Secondary | ICD-10-CM | POA: Diagnosis not present

## 2016-09-21 DIAGNOSIS — L814 Other melanin hyperpigmentation: Secondary | ICD-10-CM | POA: Diagnosis not present

## 2016-10-13 DIAGNOSIS — B199 Unspecified viral hepatitis without hepatic coma: Secondary | ICD-10-CM

## 2016-10-13 HISTORY — DX: Unspecified viral hepatitis without hepatic coma: B19.9

## 2016-10-14 ENCOUNTER — Ambulatory Visit (INDEPENDENT_AMBULATORY_CARE_PROVIDER_SITE_OTHER): Payer: 59 | Admitting: Family

## 2016-10-14 ENCOUNTER — Telehealth: Payer: Self-pay | Admitting: *Deleted

## 2016-10-14 ENCOUNTER — Encounter: Payer: Self-pay | Admitting: Family

## 2016-10-14 VITALS — BP 124/80 | HR 76 | Temp 98.5°F | Ht 64.0 in | Wt 272.2 lb

## 2016-10-14 DIAGNOSIS — T7840XA Allergy, unspecified, initial encounter: Secondary | ICD-10-CM

## 2016-10-14 DIAGNOSIS — L509 Urticaria, unspecified: Secondary | ICD-10-CM

## 2016-10-14 DIAGNOSIS — L500 Allergic urticaria: Secondary | ICD-10-CM

## 2016-10-14 MED ORDER — EPINEPHRINE 0.3 MG/0.3ML IJ SOAJ: 0.3000 mg | Freq: Once | INTRAMUSCULAR | 0 refills | Status: DC

## 2016-10-14 MED ORDER — PREDNISONE 10 MG (21) PO TBPK: ORAL_TABLET | ORAL | 0 refills | Status: DC

## 2016-10-14 NOTE — Patient Instructions (Signed)
Food Allergy A food allergy is an abnormal reaction to a food (food allergen) by the body's defense system (immune system). Foods that commonly cause allergies are:  Milk.  Seafood.  Eggs.  Nuts.  Wheat.  Soy.  What are the causes? Food allergies happen when the immune system mistakenly sees a food as harmful and releases antibodies to fight it. What are the signs or symptoms? Symptoms may be mild or severe. They usually start minutes after the food is eaten, but they can occur even a few hours later. In people with a severe allergy, symptoms can start within seconds. Mild symptoms  Nasal congestion.  Tingling in the mouth.  An itchy, red rash.  Vomiting.  Diarrhea. Severe symptoms  Swelling of the lips, face, and tongue.  Swelling of the back of the mouth and throat.  Wheezing.  A hoarse voice.  Itchy, red, swollen areas of skin (hives).  Dizziness or light-headedness.  Fainting.  Trouble breathing, speaking, or swallowing.  Chest tightness.  Rapid heartbeat. How is this diagnosed? A diagnosis is made with a physical exam, medical and family history, and one or more of the following:  Skin tests.  Blood tests.  A food diary.  The results of an elimination diet. The elimination diet involves removing foods from your diet and then adding them back in, one at a time.  How is this treated? There is no cure for allergies. An allergic reaction can be treated with medicines, such as:  Antihistamines.  Steroids.  Respiratory inhalers.  Epinephrine.  Severe symptoms can be a sign of a life-threatening reaction called anaphylaxis, and they require immediate treatment. Severe reactions usually need to be treated at a hospital. People who have had a severe reaction may be prescribed rescue medicines to take if they are accidentally exposed to an allergen. Follow these instructions at home: General instructions  Avoid the foods that you are allergic  to.  Read food labels before you eat packaged items. Look for ingredients you are allergic to.  When you are at a restaurant, tell your server that you have an allergy. If you are unsure of whether a meal has an ingredient that you are allergic to, ask your server.  Take medicines only as directed by your health care provider. Do not drive until the medicine has worn off, unless your health care provider gives you approval.  Inform all health care providers that you have a food allergy.  Contact your health care provider if you want to be tested for an allergy. If you have had an anaphylactic reaction before, you should never test yourself for an allergy without your health care provider's approval. Instructions for People with Severe Allergies  Wear a medical alert bracelet or necklace that describes your allergy.  Carry your anaphylaxis kit or an epinephrine injection with you at all times. Use them as directed by your health care provider.  Make sure that you, your family members, and your employer know: ? How to use an anaphylaxis kit. ? How to give an epinephrine injection.  Replace your epinephrine immediately after use, in case you have another reaction.  Seek medical care even after you take epinephrine. This is important because epinephrine can be followed by a delayed, life-threatening reaction. Instructions for People with a Potential Allergy  Follow the elimination diet as directed by your health care provider.  Keep a food diary as directed by your health care provider. Every day, write down: ? What you eat and   drink and when. ? What symptoms you have and when. Contact a health care provider if:  Your symptoms have not gone away within 2 days.  Your symptoms get worse.  You develop new symptoms. Get help right away if:  You use epinephrine.  You are having a severe allergic reaction. Symptoms of a severe reaction include: ? Swelling of the lips, face, and  tongue. ? Swelling of the back of the mouth and throat. ? Wheezing. ? A hoarse voice. ? Hives. ? Dizziness or light-headedness. ? Fainting. ? Trouble breathing, speaking, or swallowing. ? Chest tightness. ? Rapid heartbeat. This information is not intended to replace advice given to you by your health care provider. Make sure you discuss any questions you have with your health care provider. Document Released: 02/27/2000 Document Revised: 07/29/2015 Document Reviewed: 12/11/2013 Elsevier Interactive Patient Education  2018 Elsevier Inc[employer]  

## 2016-10-14 NOTE — Telephone Encounter (Signed)
Pt notified of RX 

## 2016-10-14 NOTE — Progress Notes (Signed)
   Subjective:    Patient ID: Mary [name], female    DOB: 04-Feb-1966, 51 y.o.   MRN: [id number]  Allergic Reaction  This is a new problem. The current episode started yesterday. The problem occurs intermittently. The problem has been rapidly improving since onset. The problem is moderate. It is unknown what she was exposed to. Associated symptoms include itching and a rash. Pertinent negatives include no eye itching or eye redness. (Pt brought in pictures of her hands and arms from last night with hives) Swelling is present on the hands (bilateral arms, abdomen, and legs). Past treatments include diphenhydramine. The treatment provided moderate relief.      Review of Systems  Eyes: Negative for redness and itching.  Skin: Positive for itching and rash.  All other systems reviewed and are negative.      Objective:   Physical Exam  Constitutional: She is oriented to person, place, and time. She appears well-developed and well-nourished. No distress.  HENT:  Head: Normocephalic.  Eyes: Pupils are equal, round, and reactive to light.  Neck: Normal range of motion. Neck supple. No thyromegaly present.  Cardiovascular: Normal rate, regular rhythm, normal heart sounds and intact distal pulses.   No murmur heard. Pulmonary/Chest: Effort normal and breath sounds normal. No respiratory distress. She has no wheezes.  Abdominal: Soft. Bowel sounds are normal. She exhibits no distension. There is no tenderness.  Musculoskeletal: Normal range of motion. She exhibits no edema or tenderness.  Neurological: She is alert and oriented to person, place, and time.  Skin: Skin is warm and dry. Urticarial rash: resolved.  Psychiatric: She has a normal mood and affect. Her behavior is normal. Judgment and thought content normal.  Vitals reviewed.   BP 124/80   Pulse 76   Temp 98.5 F (36.9 C) (Oral)   Ht 5\' 4"  (1.626 m)   Wt 272 lb 3.2 oz (123.5 kg)   BMI 46.72 kg/m      Assessment & Plan:    1. Allergic reaction, initial encounter - EPINEPHrine (EPIPEN 2-PAK) 0.3 mg/0.3 mL IJ SOAJ injection; Inject 0.3 mLs (0.3 mg total) into the muscle once.  Dispense: 1 Device; Refill: 0  2. Hives - EPINEPHrine (EPIPEN 2-PAK) 0.3 mg/0.3 mL IJ SOAJ injection; Inject 0.3 mLs (0.3 mg total) into the muscle once.  Dispense: 1 Device; Refill: 0  Write down everything she ate, drank, and came in contact with yesterday Start zyrtec daily If this occurs again will send to [facility]  If any SOB or facial swelling go to ED- Epipen given  RTO prn   Evelina Dun, FNP

## 2016-10-14 NOTE — Telephone Encounter (Signed)
Pt called to report that she is breaking out on face, feet and hands Do you want to RX something Please advise

## 2016-10-14 NOTE — Telephone Encounter (Signed)
Prednisone dose pack Prescription sent to pharmacy and allergen referral placed

## 2016-10-23 DIAGNOSIS — K2900 Acute gastritis without bleeding: Secondary | ICD-10-CM | POA: Diagnosis not present

## 2016-10-25 ENCOUNTER — Ambulatory Visit (INDEPENDENT_AMBULATORY_CARE_PROVIDER_SITE_OTHER): Payer: 59 | Admitting: Family Medicine

## 2016-10-25 ENCOUNTER — Encounter: Payer: Self-pay | Admitting: Family Medicine

## 2016-10-25 VITALS — BP 115/74 | HR 92 | Temp 100.1°F | Ht 64.0 in | Wt 263.0 lb

## 2016-10-25 DIAGNOSIS — K2970 Gastritis, unspecified, without bleeding: Secondary | ICD-10-CM | POA: Diagnosis not present

## 2016-10-25 DIAGNOSIS — Z7689 Persons encountering health services in other specified circumstances: Secondary | ICD-10-CM | POA: Diagnosis not present

## 2016-10-25 MED ORDER — GI COCKTAIL ~~LOC~~
30.0000 mL | Freq: Once | ORAL | Status: AC
  Administered 2016-10-25: 30 mL via ORAL

## 2016-10-25 NOTE — Progress Notes (Signed)
BP 115/74   Pulse 92   Temp 100.1 F (37.8 C) (Oral)   Ht 5\' 4"  (1.626 m)   Wt 263 lb (119.3 kg)   BMI 45.14 kg/m    Subjective:    Patient ID: Mary [name], female    DOB: [birth year], 51 y.o.   MRN: [id number]  HPI: Mary [name] is a 51 y.o. female presenting on 10/25/2016 for a low grade fever (99.9 at home), chills, fatigue, loss of appetite, headache, body aches and nausea x 6 days. She additionally reports a few days of diarrhea that has since resolved. She was seen for this at [facility] on Saturday and prescirbed nexium and zofram with mild relief of nausea but continued chills, aches, and low grade fever. She reports a rash 2 weeks ago that was treated here with prednisone. She denies congestion, runny nose, cough and vomiting.   HPI Relevant past medical, surgical, family and social history reviewed and updated as indicated. Interim medical history since our last visit reviewed. Allergies and medications reviewed and updated.  Review of Systems  Constitutional: Positive for appetite change (low apetite ), chills, fatigue and fever (low grade (100.1)). Negative for diaphoresis and unexpected weight change.  HENT: Positive for ear pain (left). Negative for congestion, sinus pressure, sneezing and sore throat.   Respiratory: Negative for cough, shortness of breath and wheezing.   Cardiovascular: Negative for chest pain, palpitations and leg swelling.  Gastrointestinal: Positive for abdominal pain (central burning) and nausea. Negative for vomiting.  Genitourinary: Negative for dysuria, frequency, hematuria, urgency, vaginal bleeding, vaginal discharge and vaginal pain.  Musculoskeletal: Positive for arthralgias and myalgias. Negative for gait problem and joint swelling.  Skin: Negative for color change and rash.  Neurological: Positive for headaches. Negative for seizures, weakness and numbness.  Psychiatric/Behavioral: Negative for agitation, behavioral problems and  confusion.    Per HPI unless specifically indicated above        Objective:    BP 115/74   Pulse 92   Temp 100.1 F (37.8 C) (Oral)   Ht 5\' 4"  (1.626 m)   Wt 263 lb (119.3 kg)   BMI 45.14 kg/m   Wt Readings from Last 3 Encounters:  10/25/16 263 lb (119.3 kg)  10/14/16 272 lb 3.2 oz (123.5 kg)  01/19/16 262 lb (118.8 kg)    Physical Exam  Constitutional: She is oriented to person, place, and time. She appears well-developed and well-nourished. No distress.  HENT:  Head: Normocephalic and atraumatic.  Right Ear: External ear normal.  Left Ear: External ear normal.  Nose: Nose normal.  Mouth/Throat: Oropharynx is clear and moist. No oropharyngeal exudate.  Eyes: Pupils are equal, round, and reactive to light. Conjunctivae and EOM are normal. No scleral icterus.  Neck: Normal range of motion. Neck supple. No thyromegaly present.  Cardiovascular: Normal rate, regular rhythm and normal heart sounds.   No murmur heard. Pulmonary/Chest: Effort normal and breath sounds normal. No respiratory distress. She has no wheezes. She has no rales.  Abdominal: Soft. Bowel sounds are normal. She exhibits no distension. There is tenderness (mild epigastric). There is no rebound and no guarding.  Musculoskeletal: Normal range of motion. She exhibits no tenderness.  Neurological: She is alert and oriented to person, place, and time. She has normal reflexes. No cranial nerve deficit. Coordination normal.  Skin: Skin is warm and dry. No rash noted. She is not diaphoretic.  Psychiatric: She has a normal mood and affect. Her behavior is normal. Judgment  and thought content normal.    Results for orders placed or performed in visit on 01/29/14  Cytology - PAP  Result Value Ref Range   CYTOLOGY - PAP PAP RESULT       Assessment & Plan:   Problem List Items Addressed This Visit    None    Visit Diagnoses    Viral gastritis    -  Primary      Mary [name] is a 51 y.o. female presenting  on 10/25/2016 for a low grade fever (99.9 at home), chills, fatigue, loss of appetite, headache, body aches and nausea x 6 days. She additionally reports a few days of diarrhea that has since resolved. This is likely viral gastritis given her abdominal symptoms, diarrhea, and mild epigastric tenderness on exam. I will give her a GI cocktail here in the office, and encourage her to continue with Nexium, Pepcid, and Zofran.  Follow up plan: Return here if symptoms do not improve over the next few days.   Patient seen and examined with Brock Bad M.D. Agree with assessment and plan above.   Caryl Pina, MD [facility] 10/25/2016, 9:15 AM

## 2016-10-26 ENCOUNTER — Ambulatory Visit (INDEPENDENT_AMBULATORY_CARE_PROVIDER_SITE_OTHER): Payer: 59

## 2016-10-26 ENCOUNTER — Encounter: Payer: Self-pay | Admitting: Family Medicine

## 2016-10-26 ENCOUNTER — Telehealth: Payer: Self-pay | Admitting: *Deleted

## 2016-10-26 ENCOUNTER — Ambulatory Visit (INDEPENDENT_AMBULATORY_CARE_PROVIDER_SITE_OTHER): Payer: 59 | Admitting: Family Medicine

## 2016-10-26 VITALS — BP 102/71 | HR 101 | Temp 97.8°F | Ht 64.0 in | Wt 265.0 lb

## 2016-10-26 DIAGNOSIS — K759 Inflammatory liver disease, unspecified: Secondary | ICD-10-CM

## 2016-10-26 DIAGNOSIS — M2550 Pain in unspecified joint: Secondary | ICD-10-CM

## 2016-10-26 DIAGNOSIS — R945 Abnormal results of liver function studies: Secondary | ICD-10-CM | POA: Diagnosis not present

## 2016-10-26 DIAGNOSIS — R112 Nausea with vomiting, unspecified: Secondary | ICD-10-CM

## 2016-10-26 DIAGNOSIS — R109 Unspecified abdominal pain: Secondary | ICD-10-CM

## 2016-10-26 DIAGNOSIS — K2900 Acute gastritis without bleeding: Secondary | ICD-10-CM

## 2016-10-26 LAB — CBC WITH DIFFERENTIAL/PLATELET
Basophils Absolute: 0.2 10*3/uL (ref 0.0–0.2)
Basos: 2 %
EOS (ABSOLUTE): 0 10*3/uL (ref 0.0–0.4)
Eos: 0 %
Hematocrit: 42.6 % (ref 34.0–46.6)
Hemoglobin: 14.2 g/dL (ref 11.1–15.9)
IMMATURE GRANS (ABS): 0 10*3/uL (ref 0.0–0.1)
IMMATURE GRANULOCYTES: 0 %
Lymphocytes Absolute: 3.5 10*3/uL — ABNORMAL HIGH (ref 0.7–3.1)
Lymphs: 44 %
MCH: 30.1 pg (ref 26.6–33.0)
MCHC: 33.3 g/dL (ref 31.5–35.7)
MCV: 90 fL (ref 79–97)
Monocytes Absolute: 0.7 10*3/uL (ref 0.1–0.9)
Monocytes: 9 %
NEUTROS PCT: 45 %
Neutrophils Absolute: 3.6 10*3/uL (ref 1.4–7.0)
Platelets: 134 10*3/uL — ABNORMAL LOW (ref 150–379)
RBC: 4.71 x10E6/uL (ref 3.77–5.28)
RDW: 13.2 % (ref 12.3–15.4)
WBC: 7.9 10*3/uL (ref 3.4–10.8)

## 2016-10-26 LAB — CMP14+EGFR
A/G RATIO: 1 — AB (ref 1.2–2.2)
ALT: 173 IU/L — AB (ref 0–32)
AST: 121 IU/L — ABNORMAL HIGH (ref 0–40)
Albumin: 3.4 g/dL — ABNORMAL LOW (ref 3.5–5.5)
Alkaline Phosphatase: 133 IU/L — ABNORMAL HIGH (ref 39–117)
BILIRUBIN TOTAL: 0.9 mg/dL (ref 0.0–1.2)
BUN/Creatinine Ratio: 11 (ref 9–23)
BUN: 10 mg/dL (ref 6–24)
CHLORIDE: 100 mmol/L (ref 96–106)
CO2: 23 mmol/L (ref 20–29)
Calcium: 8.3 mg/dL — ABNORMAL LOW (ref 8.7–10.2)
Creatinine, Ser: 0.88 mg/dL (ref 0.57–1.00)
GFR calc Af Amer: 89 mL/min/{1.73_m2} (ref >59)
GFR calc non Af Amer: 77 mL/min/{1.73_m2} (ref >59)
GLUCOSE: 147 mg/dL — AB (ref 65–99)
Globulin, Total: 3.5 g/dL (ref 1.5–4.5)
POTASSIUM: 3.7 mmol/L (ref 3.5–5.2)
Sodium: 137 mmol/L (ref 134–144)
Total Protein: 6.9 g/dL (ref 6.0–8.5)

## 2016-10-26 LAB — MICROSCOPIC EXAMINATION: Renal Epithel, UA: NONE SEEN /hpf

## 2016-10-26 LAB — URINALYSIS, COMPLETE
BILIRUBIN UA: POSITIVE — AB
Glucose, UA: NEGATIVE
Leukocytes, UA: NEGATIVE
Nitrite, UA: NEGATIVE
PH UA: 5.5 (ref 5.0–7.5)
Specific Gravity, UA: 1.02 (ref 1.005–1.030)
UUROB: 4 mg/dL — AB (ref 0.2–1.0)

## 2016-10-26 NOTE — Telephone Encounter (Signed)
Incoming call from pt  Pt states she is no better today Continued fever and body aches Wanting lab results Please advise

## 2016-10-26 NOTE — Progress Notes (Signed)
Subjective:  Patient ID: Mary [name], female    DOB: Nov 16, 1965  Age: 51 y.o. MRN: [id number]  CC: Chills (pt here today c/o chills, fever on and off and she was seen at Urgent Care this past weekend and saw Dr Dettinger yesterday but isn't feeling any better.)   HPI Mary [name] presents for Onset 6 days ago of chills malaise and fatigue. Starting out mild but increasing daily. Over the last 2-3 days she's noted decreased appetite increasing nausea and frontal headache as well as subjective fever. The pain that she's been having is at the left costal margin she describes it as moderately severe and dull intermittently. She went to urgent care 3 days ago and was given Zofran which gives her a little bit of relief. She saw Dr. Warrick Parisian yesterday, that note was reviewed. She had blood work yesterday and transaminase E Mia as well as elevation of alk phosphatase was noted. She was given a GI cocktail and says that didn't really help. Of note is that she has not taken any significant amounts of Tylenol although she has been taking occasional ibuprofen. She was treated 12 days ago for a nonspecific allergic type rash with Mary [name] with a prednisone Dosepak.  Depression screen [facility] 2/9 10/25/2016 10/14/2016 01/19/2016  Decreased Interest 0 0 0  Down, Depressed, Hopeless 0 0 0  PHQ - 2 Score 0 0 0    History Mary [name] has a past medical history of Arthritis; GERD (gastroesophageal reflux disease); and Seasonal allergies.   She has a past surgical history that includes Tubal ligation (2004) and Carpal tunnel release (Left, 12/05/2012).   Her family history is not on file.She reports that she has never smoked. She has never used smokeless tobacco. She reports that she drinks alcohol. She reports that she does not use drugs.    ROS Review of Systems  Constitutional: Positive for activity change, appetite change (diminished), diaphoresis, fatigue and fever.  HENT: Negative for congestion, rhinorrhea  and sore throat.   Eyes: Negative for visual disturbance.  Respiratory: Negative for cough and shortness of breath.   Cardiovascular: Negative for chest pain and palpitations.  Gastrointestinal: Positive for abdominal distention, abdominal pain, nausea and vomiting. Negative for blood in stool, constipation and diarrhea.  Genitourinary: Negative for dysuria, frequency, hematuria (urine is noted to be dark) and urgency.  Musculoskeletal: Negative for arthralgias and myalgias.    Objective:  BP 102/71   Pulse (!) 101   Temp 97.8 F (36.6 C) (Oral)   Ht _0  (1.626 m)   Wt 265 lb (120.2 kg)   BMI 45.49 kg/m   BP Readings from Last 3 Encounters:  10/26/16 102/71  10/25/16 115/74  10/14/16 124/80    Wt Readings from Last 3 Encounters:  10/26/16 265 lb (120.2 kg)  10/25/16 263 lb (119.3 kg)  10/14/16 272 lb 3.2 oz (123.5 kg)     Physical Exam  Constitutional: She is oriented to person, place, and time. She appears well-developed and well-nourished.  HENT:  Head: Normocephalic and atraumatic.  Cardiovascular: Normal rate and regular rhythm.   No murmur heard. Pulmonary/Chest: Effort normal and breath sounds normal.  Abdominal: Soft. Bowel sounds are normal. She exhibits no mass. There is tenderness. There is no rebound and no guarding.  Neurological: She is alert and oriented to person, place, and time.  Skin: Skin is warm and dry.  Psychiatric: She has a normal mood and affect. Her behavior is normal.  Assessment & Plan:   Mary [name] was seen today for chills.  Diagnoses and all orders for this visit:  Hepatitis  Acute gastritis, presence of bleeding unspecified, unspecified gastritis type -     Urine Culture -     Urinalysis, Complete -     Obstetric Panel -     Anti-Smooth Muscle Antibody, IGG -     AntiMicrosomal Ab-Liver / Kidney -     DG Abd 1 View; Future -     Ambulatory referral to Gastroenterology  Arthralgia, unspecified joint -     ANA,IFA RA Diag  Pnl w/rflx Tit/Patn  Abdominal pain, unspecified abdominal location -     CT Abdomen Pelvis W Contrast; Future  Abnormal results of liver function studies -     Ambulatory referral to Gastroenterology -     CT Abdomen Pelvis W Contrast; Future  Nausea and vomiting, intractability of vomiting not specified, unspecified vomiting type -     CT Abdomen Pelvis W Contrast; Future  Other orders -     Microscopic Examination    Urinalysis showed marked urobilinogen. Lab showed significant elevation of AST, ALT, alkaline phosphatase   I am having Mary [name] maintain her glucosamine-chondroitin, cetirizine, ibuprofen, escitalopram, naproxen, predniSONE, famotidine, esomeprazole, and ondansetron.  Allergies as of 10/26/2016   No Known Allergies     Medication List       Accurate as of 10/26/16  7:09 PM. Always use your most recent med list.          cetirizine 10 MG tablet Commonly known as:  ZYRTEC Take 10 mg by mouth daily.   escitalopram 10 MG tablet Commonly known as:  LEXAPRO Take 10 mg by mouth daily.   esomeprazole 40 MG capsule Commonly known as:  NEXIUM Take 40 mg by mouth daily at 12 noon.   famotidine 10 MG tablet Commonly known as:  PEPCID Take 10 mg by mouth daily.   glucosamine-chondroitin 500-400 MG tablet Take 1 tablet by mouth daily.   ibuprofen 200 MG tablet Commonly known as:  ADVIL,MOTRIN Take 200 mg by mouth as needed for pain.   naproxen 500 MG tablet Commonly known as:  NAPROSYN Take 1 tablet (500 mg total) by mouth 2 (two) times daily with a meal.   ondansetron 8 MG disintegrating tablet Commonly known as:  ZOFRAN-ODT Take 8 mg by mouth every 8 (eight) hours as needed.   predniSONE 10 MG (21) Tbpk tablet Commonly known as:  STERAPRED UNI-PAK 21 TAB Use as directed        Follow-up: Return in about 5 days (around 10/31/2016) for Hepatitis, with Dr. Warrick Parisian.  Claretta Fraise, M.D.

## 2016-10-27 ENCOUNTER — Telehealth: Payer: Self-pay | Admitting: Gastroenterology

## 2016-10-27 ENCOUNTER — Telehealth: Payer: Self-pay

## 2016-10-27 LAB — URINE CULTURE

## 2016-10-27 LAB — ANA,IFA RA DIAG PNL W/RFLX TIT/PATN
ANA TITER 1: NEGATIVE
Cyclic Citrullin Peptide Ab: 17 units (ref 0–19)
RHEUMATOID FACTOR: 24.9 [IU]/mL — AB (ref 0.0–13.9)

## 2016-10-27 LAB — ANTI-SMOOTH MUSCLE ANTIBODY, IGG: Smooth Muscle Ab: 22 Units — ABNORMAL HIGH (ref 0–19)

## 2016-10-27 LAB — ANTI-MICROSOMAL ANTIBODY LIVER / KIDNEY: LKM1 Ab: 3.9 Units (ref 0.0–20.0)

## 2016-10-27 NOTE — Telephone Encounter (Signed)
Please advise 

## 2016-10-27 NOTE — Telephone Encounter (Signed)
[facility] called and the first available is 9./23   YOu took me out of work  What do I do?

## 2016-10-27 NOTE — Telephone Encounter (Signed)
DOD is Pyrtle.

## 2016-10-27 NOTE — Telephone Encounter (Signed)
Please schedule with a PA this week in an afternoon slot.

## 2016-10-27 NOTE — Telephone Encounter (Signed)
Please see if [place] in [place] can see her. I will talk with them about the urgency of the appt if needed. Thanks, WS

## 2016-10-28 NOTE — Telephone Encounter (Signed)
I addressed this concern in a lab result note attached to her report.

## 2016-10-28 NOTE — Telephone Encounter (Signed)
Spoke to pt and she is waiting on her referral for GI to go through. 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 He is off today so I wanted to see if you would look at the results and also did you want me to go ahead and put in a referral for Rheumatology?

## 2016-10-28 NOTE — Telephone Encounter (Signed)
I just put in the results and another message but she did have very significantly elevated liver function and I wanted her to come get tested for hepatitis, this what I believe is what I sent a message.

## 2016-10-28 NOTE — Telephone Encounter (Signed)
Noted on the result note and will check back tomorrow for results on Hepatitis Panel.

## 2016-10-28 NOTE — Telephone Encounter (Signed)
Pt is scheduled with Nicoletta Ba for 10-29-16

## 2016-10-29 ENCOUNTER — Encounter: Payer: Self-pay | Admitting: Physician Assistant

## 2016-10-29 ENCOUNTER — Other Ambulatory Visit (INDEPENDENT_AMBULATORY_CARE_PROVIDER_SITE_OTHER): Payer: 59

## 2016-10-29 ENCOUNTER — Ambulatory Visit (INDEPENDENT_AMBULATORY_CARE_PROVIDER_SITE_OTHER): Payer: 59 | Admitting: Physician Assistant

## 2016-10-29 VITALS — BP 102/60 | HR 111 | Temp 98.6°F | Ht 64.0 in | Wt 263.0 lb

## 2016-10-29 DIAGNOSIS — R5383 Other fatigue: Secondary | ICD-10-CM | POA: Diagnosis not present

## 2016-10-29 DIAGNOSIS — R110 Nausea: Secondary | ICD-10-CM | POA: Diagnosis not present

## 2016-10-29 DIAGNOSIS — B179 Acute viral hepatitis, unspecified: Secondary | ICD-10-CM

## 2016-10-29 DIAGNOSIS — D729 Disorder of white blood cells, unspecified: Secondary | ICD-10-CM

## 2016-10-29 LAB — PROTIME-INR
INR: 1.1 ratio — AB (ref 0.8–1.0)
PROTHROMBIN TIME: 12.4 s (ref 9.6–13.1)

## 2016-10-29 LAB — COMPREHENSIVE METABOLIC PANEL
ALT: 90 U/L — ABNORMAL HIGH (ref 0–35)
AST: 124 U/L — ABNORMAL HIGH (ref 0–37)
Albumin: 2.8 g/dL — ABNORMAL LOW (ref 3.5–5.2)
Alkaline Phosphatase: 179 U/L — ABNORMAL HIGH (ref 39–117)
BUN: 15 mg/dL (ref 6–23)
CALCIUM: 8.3 mg/dL — AB (ref 8.4–10.5)
CHLORIDE: 101 meq/L (ref 96–112)
CO2: 28 meq/L (ref 19–32)
Creatinine, Ser: 0.78 mg/dL (ref 0.40–1.20)
GFR: 82.95 mL/min (ref >60.00)
Glucose, Bld: 135 mg/dL — ABNORMAL HIGH (ref 70–99)
Potassium: 3.4 mEq/L — ABNORMAL LOW (ref 3.5–5.1)
Sodium: 136 mEq/L (ref 135–145)
Total Bilirubin: 0.9 mg/dL (ref 0.2–1.2)
Total Protein: 6.3 g/dL (ref 6.0–8.3)

## 2016-10-29 LAB — CBC WITH DIFFERENTIAL/PLATELET
BASOS PCT: 0.6 % (ref 0.0–3.0)
Basophils Absolute: 0 10*3/uL (ref 0.0–0.1)
EOS ABS: 0 10*3/uL (ref 0.0–0.7)
Eosinophils Relative: 0 % (ref 0.0–5.0)
HEMATOCRIT: 40.5 % (ref 36.0–46.0)
Hemoglobin: 13.6 g/dL (ref 12.0–15.0)
Lymphs Abs: 5.3 10*3/uL — ABNORMAL HIGH (ref 0.7–4.0)
MCHC: 33.6 g/dL (ref 30.0–36.0)
MCV: 89.8 fl (ref 78.0–100.0)
MONOS PCT: 5.5 % (ref 3.0–12.0)
Monocytes Absolute: 0.4 10*3/uL (ref 0.1–1.0)
NEUTROS ABS: 2.3 10*3/uL (ref 1.4–7.7)
Neutrophils Relative %: 28 % — ABNORMAL LOW (ref 43.0–77.0)
PLATELETS: 178 10*3/uL (ref 150.0–400.0)
RBC: 4.5 Mil/uL (ref 3.87–5.11)
RDW: 13.5 % (ref 11.5–15.5)
WBC: 8.1 10*3/uL (ref 4.0–10.5)

## 2016-10-29 LAB — SPECIMEN STATUS REPORT

## 2016-10-29 LAB — HEPATITIS PANEL, ACUTE
HEP A IGM: NEGATIVE
HEP B S AG: NEGATIVE
HEP C VIRUS AB: 0.2 {s_co_ratio} (ref 0.0–0.9)
Hep B C IgM: NEGATIVE

## 2016-10-29 LAB — SEDIMENTATION RATE: Sed Rate: 47 mm/hr — ABNORMAL HIGH (ref 0–30)

## 2016-10-29 NOTE — Progress Notes (Signed)
Agree with the assessment and plan as outlined. With new thrombocytopenia, Ricketsiall illness needs to be rule out (Lyme, erlichiosis, etc). Also, mildly positive SMA, would send total IgG level to further evaluate for autoimmune hepatitis, but agree infectious seems most likely at this time. Will await cross sectional imaging. Please let me know if any of her labs are positive.

## 2016-10-29 NOTE — Progress Notes (Signed)
Subjective:    Patient ID: Mary [name], female    DOB: Feb 03, 1966, 51 y.o.   MRN: [id number]  HPI [name] is a pleasant 51 year old white female, new to GI today referred by Dr. Katharine Look  family medicine for evaluation of 2 week illness associated with elevated LFTs. Patient says her symptoms started about 12 days ago when she developed chills, headache malaise fatigue nausea and complete lack of appetite. She also had rather diffuse myalgias. She said she developed her symptoms for about 4 days and then went to her PCP. She had also gone to an urgent care briefly that weekend and had been started on Zofran when necessary and Nexium. She has continued to feel very fatigued with poor appetite and nausea without vomiting. She's had fullness and discomfort in the upper abdomen no significant diarrhea. Labs done on 10/25/2016 CBC within normal limits platelets slightly low at 134, T bili 0.9 alkaline phosphatase 133 AST of 121 ALT of 173. Acute hepatitis panel was negative ANA negative, anti-smooth muscle antibody slightly elevated at 22. She is been out of work this week and has felt a little bit better the past couple of days says her nausea has improved and her headache is gone. Having intermittent chills and sweats. She's not had any known infectious exposures, no tick bites, no new meds supplements etc. and no EtOH use. She has no prior history of liver disease. Sign Family history pertinent for grandfather with cirrhosis, secondary to EtOH. She is scheduled for CT of the abdomen and pelvis on 11/02/2016.  Review of Systems Pertinent positive and negative review of systems were noted in the above HPI section.  All other review of systems was otherwise negative.  Outpatient Encounter Prescriptions as of 10/29/2016  Medication Sig  . cetirizine (ZYRTEC) 10 MG tablet Take 10 mg by mouth daily.  Marland Kitchen escitalopram (LEXAPRO) 10 MG tablet Take 10 mg by mouth daily.  Marland Kitchen esomeprazole (NEXIUM) 40  MG capsule Take 40 mg by mouth daily at 12 noon.  . famotidine (PEPCID) 10 MG tablet Take 10 mg by mouth daily.  Marland Kitchen glucosamine-chondroitin 500-400 MG tablet Take 1 tablet by mouth daily.  Marland Kitchen ibuprofen (ADVIL,MOTRIN) 200 MG tablet Take 200 mg by mouth as needed for pain.  Marland Kitchen ondansetron (ZOFRAN-ODT) 8 MG disintegrating tablet Take 8 mg by mouth every 8 (eight) hours as needed.  . [DISCONTINUED] naproxen (NAPROSYN) 500 MG tablet Take 1 tablet (500 mg total) by mouth 2 (two) times daily with a meal.  . [DISCONTINUED] predniSONE (STERAPRED UNI-PAK 21 TAB) 10 MG (21) TBPK tablet Use as directed   No facility-administered encounter medications on file as of 10/29/2016.    No Known Allergies There are no active problems to display for this patient.  Social History   Social History  . Marital status: Married    Spouse name: N/A  . Number of children: N/A  . Years of education: N/A   Occupational History  . Not on file.   Social History Main Topics  . Smoking status: Never Smoker  . Smokeless tobacco: Never Used  . Alcohol use Yes     Comment: occasional  . Drug use: No  . Sexual activity: Not on file   Other Topics Concern  . Not on file   Social History Narrative  . No narrative on file    Ms. Greth's family history is not on file.      Objective:    Vitals:   10/29/16 1419  BP: 102/60  Pulse: (!) 111  Temp: 98.6 F (37 C)    Physical Exam  well-developed white female in no acute distress, accompanied by her sister temp 98 6, BMI 45.1. HEENT; nontraumatic normocephalic EOMI PERRLA sclera anicteric, Cardiovascular; regular rate and rhythm with S1-S2 no murmur or gallop, Pulmonary; clear bilaterally, Abdomen ;obese, soft he has some mild tenderness in the epigastrium there is no palpable hepatomegaly or splenomegaly bowel sounds are present, Rectal ;exam not done, Ext; no clubbing cyanosis or edema skin warm and dry no rash, Neuropsych; mood and affect appropriate         Assessment & Plan:   #4 51 year old white female with acute illness over the past 12 days, with symptoms of malaise, fatigue, myalgias, headache which has since resolved, nausea, poor appetite and fullness in the upper abdomen. She has elevated LFTs and mild thrombocytopenia. Acute hepatitis panel negative for hepatitis A, B, and C, ANA negative.  Picture is most consistent with an acute viral syndrome with associated hepatitis-  will rule out a EBV, CMV and Lyme Repeat labs today, including CBC and pro time INR Proceed with CT of the abdomen and pelvis which had been scheduled for a 21 2018. Have written note for patient to stay out of work for one more week Soft bland diet with small frequent meals Zofran 4 mg every 6 hours when necessary for nausea Continue Nexium 40 mg by mouth daily for 2 weeks Further plans pending results of above, she will be established with Dr. Havery Moros Follow-up will be determined based on findings of above.  Silvina Hackleman Genia Harold PA-C 10/29/2016   Cc: Hassell Done, Mary-Margaret, *

## 2016-10-29 NOTE — Patient Instructions (Signed)
Please go to the basement level to have your labs drawn.  Continue Zofran 4 mg, take 1 tab every 6 hours as needed.  Continue Nexium 40 mg, take 1 tab caily for 2 weeks.   You have been scheduled for a CT scan of the abdomen and pelvis at [facility] to patient registration.   You are scheduled on 11-02-2016 at 4:00 PM. You should arrive 15 minutes prior to your appointment time for registration. Please follow the written instructions below on the day of your exam:  WARNING: IF YOU ARE ALLERGIC TO IODINE/X-RAY DYE, PLEASE NOTIFY RADIOLOGY IMMEDIATELY AT [phone]! YOU WILL BE GIVEN A 13 HOUR PREMEDICATION PREP.  1) Do not eat or drink anything after 12:00 NOON (4 hours prior to your test) 2) You have been given 2 bottles of oral contrast to drink. The solution may taste better if refrigerated, but do NOT add ice or any other liquid to this solution. Shake well before drinking.    Drink 1 bottle of contrast @ 2:00 PM (2 hours prior to your exam)  Drink 1 bottle of contrast @ 3:00 PM (1 hour prior to your exam)  You may take any medications as prescribed with a small amount of water except for the following: Metformin, Glucophage, Glucovance, Avandamet, Riomet, Fortamet, Actoplus Met, Janumet, Glumetza or Metaglip. The above medications must be held the day of the exam AND 48 hours after the exam.  The purpose of you drinking the oral contrast is to aid in the visualization of your intestinal tract. The contrast solution may cause some diarrhea. Before your exam is started, you will be given a small amount of fluid to drink. Depending on your individual set of symptoms, you may also receive an intravenous injection of x-ray contrast/dye. Plan on being at [facility] for 30 minutes or long, depending on the type of exam you are having performed.  If you have any questions regarding your exam or if you need to reschedule, you may call the CT department at [phone] between  the hours of 8:00 am and 5:00 pm, Monday-Friday.  ________________________________________________________________________

## 2016-11-01 ENCOUNTER — Other Ambulatory Visit: Payer: Self-pay

## 2016-11-01 LAB — EPSTEIN-BARR VIRUS VCA, IGG: EBV VCA IgG: >750 U/mL — ABNORMAL HIGH

## 2016-11-01 LAB — PATHOLOGIST SMEAR REVIEW

## 2016-11-01 LAB — EPSTEIN-BARR VIRUS VCA, IGM: EBV VCA IgM: >160 U/mL — ABNORMAL HIGH

## 2016-11-02 ENCOUNTER — Ambulatory Visit (HOSPITAL_COMMUNITY)
Admission: RE | Admit: 2016-11-02 | Discharge: 2016-11-02 | Disposition: A | Discharge status: Home / Self Care | Payer: 59 | Source: Ambulatory Visit | Attending: Family Medicine | Admitting: Family Medicine

## 2016-11-02 DIAGNOSIS — R945 Abnormal results of liver function studies: Secondary | ICD-10-CM | POA: Diagnosis not present

## 2016-11-02 DIAGNOSIS — K760 Fatty (change of) liver, not elsewhere classified: Secondary | ICD-10-CM | POA: Insufficient documentation

## 2016-11-02 DIAGNOSIS — R109 Unspecified abdominal pain: Secondary | ICD-10-CM | POA: Insufficient documentation

## 2016-11-02 DIAGNOSIS — R112 Nausea with vomiting, unspecified: Secondary | ICD-10-CM | POA: Diagnosis not present

## 2016-11-02 LAB — CMV ABS, IGG+IGM (CYTOMEGALOVIRUS): CYTOMEGALOVIRUS AB-IGG: 1.9 U/mL — AB (ref <0.60)

## 2016-11-02 LAB — EBV EARLY ANTIGEN AB PROF, QN

## 2016-11-02 MED ORDER — IOPAMIDOL (ISOVUE-300) INJECTION 61%
100.0000 mL | Freq: Once | INTRAVENOUS | Status: AC | PRN
  Administered 2016-11-02: 100 mL via INTRAVENOUS

## 2016-11-02 MED ORDER — IOPAMIDOL (ISOVUE-300) INJECTION 61%
INTRAVENOUS | Status: AC
  Filled 2016-11-02: qty 100

## 2016-11-03 ENCOUNTER — Other Ambulatory Visit: Payer: Self-pay

## 2016-11-03 ENCOUNTER — Other Ambulatory Visit: Payer: Self-pay | Admitting: *Deleted

## 2016-11-03 ENCOUNTER — Telehealth: Payer: Self-pay | Admitting: Physician Assistant

## 2016-11-03 DIAGNOSIS — B349 Viral infection, unspecified: Secondary | ICD-10-CM

## 2016-11-03 DIAGNOSIS — R935 Abnormal findings on diagnostic imaging of other abdominal regions, including retroperitoneum: Secondary | ICD-10-CM

## 2016-11-03 NOTE — Telephone Encounter (Signed)
Spoke with the patient. Reviewed her labs to date. She is aware of the plan to date. Understands there may be more to come after her labs are reviewed by Dr Silverio Decamp and I will contact her if there is. She does not feel very. No appetite and extreme fatigue. Repeat labs 11/12/16. Out of work note to cover until 11/16/16.

## 2016-11-03 NOTE — Telephone Encounter (Signed)
Patient calling for results from lab work on 10/29/16 and CT scan ordered by pcp on 11/02/16

## 2016-11-04 ENCOUNTER — Telehealth: Payer: Self-pay | Admitting: Physician Assistant

## 2016-11-04 ENCOUNTER — Other Ambulatory Visit: Payer: Self-pay

## 2016-11-04 DIAGNOSIS — B349 Viral infection, unspecified: Secondary | ICD-10-CM

## 2016-11-04 NOTE — Telephone Encounter (Signed)
I called and advised the patient at [facility]- [phone] regarding the disability paperwork.

## 2016-11-04 NOTE — Telephone Encounter (Signed)
I spoke with the patient about this yesterday. I won't know if it comes through. It will not come across my desk. No fax machine back here.  Pam have you seen anything?

## 2016-11-08 ENCOUNTER — Telehealth: Payer: Self-pay | Admitting: Nurse Practitioner

## 2016-11-08 NOTE — Telephone Encounter (Signed)
Pt aware I called [employer] today, received forms, filled them out & they are on Dr. Livia Snellen desk to be signed.

## 2016-11-08 NOTE — Telephone Encounter (Signed)
Needs paper work for disability today/

## 2016-11-09 ENCOUNTER — Other Ambulatory Visit (INDEPENDENT_AMBULATORY_CARE_PROVIDER_SITE_OTHER): Payer: 59

## 2016-11-09 ENCOUNTER — Telehealth: Payer: Self-pay | Admitting: Physician Assistant

## 2016-11-09 DIAGNOSIS — B349 Viral infection, unspecified: Secondary | ICD-10-CM | POA: Diagnosis not present

## 2016-11-09 LAB — HEPATIC FUNCTION PANEL
ALBUMIN: 3.3 g/dL — AB (ref 3.5–5.2)
ALK PHOS: 134 U/L — AB (ref 39–117)
ALT: 93 U/L — AB (ref 0–35)
AST: 169 U/L — AB (ref 0–37)
Bilirubin, Direct: 0.4 mg/dL — ABNORMAL HIGH (ref 0.0–0.3)
TOTAL PROTEIN: 7.1 g/dL (ref 6.0–8.3)
Total Bilirubin: 0.9 mg/dL (ref 0.2–1.2)

## 2016-11-09 LAB — PROTIME-INR
INR: 1.1 ratio — ABNORMAL HIGH (ref 0.8–1.0)
Prothrombin Time: 11.9 s (ref 9.6–13.1)

## 2016-11-09 NOTE — Telephone Encounter (Signed)
The work note has her out until 11/17/16. Please let me know what you think about the most recent labs. Thank you.

## 2016-11-09 NOTE — Telephone Encounter (Signed)
LMOVM that papers were faxed last evening about 5pm

## 2016-11-10 NOTE — Telephone Encounter (Signed)
Patient is aware. She does want to be seen. She is unsure when she should return to work and would like to discuss with her provider.

## 2016-11-10 NOTE — Telephone Encounter (Signed)
Feeling a little bit better but still easily fatigued. Appetite is poor, but she focuses on hydration  And nutritious foods. She has been afebrile for several days. When do you want to repeat her labs?

## 2016-11-10 NOTE — Telephone Encounter (Signed)
Repeat CBC , CMET in one month

## 2016-11-10 NOTE — Telephone Encounter (Signed)
Labs from yesterday are stable - she still has elevation of liver tests  Which is to be expected - may take several more weeks to resolve - I think return to work should be based on her symptoms , not the LFT's . Happy to see her if needed  For  her  work purposes

## 2016-11-12 ENCOUNTER — Encounter: Payer: Self-pay | Admitting: Physician Assistant

## 2016-11-12 ENCOUNTER — Ambulatory Visit (INDEPENDENT_AMBULATORY_CARE_PROVIDER_SITE_OTHER): Payer: 59 | Admitting: Physician Assistant

## 2016-11-12 VITALS — BP 102/74 | HR 68 | Ht 64.0 in | Wt 251.2 lb

## 2016-11-12 DIAGNOSIS — K2970 Gastritis, unspecified, without bleeding: Secondary | ICD-10-CM | POA: Diagnosis not present

## 2016-11-12 DIAGNOSIS — R5383 Other fatigue: Secondary | ICD-10-CM

## 2016-11-12 DIAGNOSIS — B179 Acute viral hepatitis, unspecified: Secondary | ICD-10-CM

## 2016-11-12 MED ORDER — ESOMEPRAZOLE MAGNESIUM 40 MG PO CPDR: DELAYED_RELEASE_CAPSULE | ORAL | 1 refills | Status: DC

## 2016-11-12 NOTE — Patient Instructions (Addendum)
We have sent the following medications to your pharmacy for you to pick up at your convenience: [facility], [place].  Nexium 40 mg.   Take a Multivitamin daily.  Push fluids.  Snack between meals. High Protein.   Come to our lab, basement level, on Friday 9-14 or Monday 9-17.  We have provided you a work note to return to work on 12-01-2016. Copy will be faxed to [place].

## 2016-11-12 NOTE — Progress Notes (Signed)
Reviewed and agree with documentation and assessment and plan. K. Veena Clay Menser , MD   

## 2016-11-12 NOTE — Progress Notes (Signed)
Subjective:    Patient ID: Mary [name], female    DOB: November 15, 1965, 51 y.o.   MRN: [id number]  HPI Mary [name] is a pleasant 51 year old white female who comes in today for follow-up. She was initially seen in our office on 10/29/2016 by myself for 2 week illness associated with elevated LFTs. Patient had developed abrupt onset of malaise, significant fatigue rather diffuse myalgias headache low-grade fevers nausea and poor appetite, and fullness in her upper abdomen. She had mildly elevated LFTs and a mild thrombocytopenia. Acute hepatitis serologies were negative for hepatitis A, B, and C and ANA was negative. Subsequent serologies were sent for EBV and CMV, and she underwent CT of the abdomen and pelvis on 11/02/2016 which did show fatty changes in her liver, no evidence of splenomegaly and had some slightly prominent lymph nodes adjacent to the body of the pancreas likely reactive in nature. Treated six-month follow-up was recommended to assess for stability/resolution. EBV VCA IgM markedly positive at greater than 160, EBV VCA IgG greater than 750. CMV antibody IgG 1.9, and CMV IgM greater than 240. Most recent labs on 11/09/2016 T bili 0.9, alkaline phosphatase 134, AST 169, ALT 93 INR 1.1 Patient has been out of work over the past couple of weeks on short-term disability due to above illness. She says she continues to feel very fatigued but is better than she was 2 weeks ago. She still has no appetite and fills up very quickly. She has had some subxiphoid discomfort which she says feels like heartburn or indigestion. Her myalgias and headache have improved she's not had any fever over the past 5 days. She is trying to be a little bit more active at home but says she gets up and does something feels very fatigued and needs to sit back down. She continues to worry about her liver. The initial plan had been to return to work on 11/17/2016.  Review of Systems Pertinent positive and negative review of  systems were noted in the above HPI section.  All other review of systems was otherwise negative.  Outpatient Encounter Prescriptions as of 11/12/2016  Medication Sig  . cetirizine (ZYRTEC) 10 MG tablet Take 10 mg by mouth daily.  Marland Kitchen escitalopram (LEXAPRO) 10 MG tablet Take 10 mg by mouth daily.  Marland Kitchen esomeprazole (NEXIUM) 40 MG capsule Take 1 capsule daily.  . famotidine (PEPCID) 10 MG tablet Take 10 mg by mouth daily.  Marland Kitchen glucosamine-chondroitin 500-400 MG tablet Take 1 tablet by mouth daily.  Marland Kitchen ibuprofen (ADVIL,MOTRIN) 200 MG tablet Take 200 mg by mouth as needed for pain.  Marland Kitchen ondansetron (ZOFRAN-ODT) 8 MG disintegrating tablet Take 8 mg by mouth every 8 (eight) hours as needed.  . [DISCONTINUED] esomeprazole (NEXIUM) 40 MG capsule Take 40 mg by mouth daily at 12 noon.   No facility-administered encounter medications on file as of 11/12/2016.    No Known Allergies There are no active problems to display for this patient.  Social History   Social History  . Marital status: Married    Spouse name: N/A  . Number of children: N/A  . Years of education: N/A   Occupational History  . Not on file.   Social History Main Topics  . Smoking status: Never Smoker  . Smokeless tobacco: Never Used  . Alcohol use Yes     Comment: occasional  . Drug use: No  . Sexual activity: Not on file   Other Topics Concern  . Not on file   Social  History Narrative  . No narrative on file    [name] family history is not on file.      Objective:    Vitals:   11/12/16 0828  BP: 102/74  Pulse: 68    Physical Exam well-developed white female in no acute distress, accompanied by family member, blood pressure 102/74 pulse 68, 5 foot 4, weight 251, BMI 43.1. HEENT ;nontraumatic normocephalic EOMI PERRLA sclera anicteric, Cardiovascular; regular rate and rhythm with S1-S2 no murmur rub or gallop, Pulmonary; clear bilaterally, Abdomen; soft, no focal tenderness no guarding or rebound no palpable  mass or hepatosplenomegaly bowel sounds present, Extremities; no clubbing cyanosis or edema skin warm and dry no rash, Neuropsych; mood and affect appropriate       Assessment & Plan:   #48 51 year old white female with acute prolonged viral syndrome with hepatitis. Pictures most consistent with EBV infection with mononucleosis type illness and hepatitis. Liver tests have been stable, no evidence of hepatic compromise. Patient remains very fatigued, with poor appetite and early satiety  #2 fatty liver on CT #3 GERD  Plan; Restart Nexium 40 mg by mouth every morning Patient will start a multivitamin, and B vitamin supplement Advised to eating 5 times daily with small meals and snacks We'll plan a repeat labs in 2-3 weeks Patient is not ready to return to work, and will extend work leave through 12/01/2016.    Mary [name] Genia Harold PA-C 11/12/2016   Cc: Hassell Done, Mary [name], *

## 2016-11-24 DIAGNOSIS — Z85828 Personal history of other malignant neoplasm of skin: Secondary | ICD-10-CM | POA: Diagnosis not present

## 2016-11-24 DIAGNOSIS — L905 Scar conditions and fibrosis of skin: Secondary | ICD-10-CM | POA: Diagnosis not present

## 2016-11-25 ENCOUNTER — Other Ambulatory Visit (INDEPENDENT_AMBULATORY_CARE_PROVIDER_SITE_OTHER): Payer: 59

## 2016-11-25 DIAGNOSIS — B179 Acute viral hepatitis, unspecified: Secondary | ICD-10-CM | POA: Diagnosis not present

## 2016-11-25 DIAGNOSIS — K2970 Gastritis, unspecified, without bleeding: Secondary | ICD-10-CM | POA: Diagnosis not present

## 2016-11-25 DIAGNOSIS — R5383 Other fatigue: Secondary | ICD-10-CM

## 2016-11-25 LAB — CBC WITH DIFFERENTIAL/PLATELET
BASOS ABS: 0.1 10*3/uL (ref 0.0–0.1)
Basophils Relative: 1.2 % (ref 0.0–3.0)
EOS ABS: 0.1 10*3/uL (ref 0.0–0.7)
Eosinophils Relative: 2.5 % (ref 0.0–5.0)
HEMATOCRIT: 39.2 % (ref 36.0–46.0)
HEMOGLOBIN: 13.1 g/dL (ref 12.0–15.0)
LYMPHS PCT: 44.1 % (ref 12.0–46.0)
Lymphs Abs: 2.4 10*3/uL (ref 0.7–4.0)
MCHC: 33.4 g/dL (ref 30.0–36.0)
MCV: 89.5 fl (ref 78.0–100.0)
Monocytes Absolute: 0.4 10*3/uL (ref 0.1–1.0)
Monocytes Relative: 7.4 % (ref 3.0–12.0)
Neutro Abs: 2.5 10*3/uL (ref 1.4–7.7)
Neutrophils Relative %: 44.8 % (ref 43.0–77.0)
PLATELETS: 234 10*3/uL (ref 150.0–400.0)
RBC: 4.37 Mil/uL (ref 3.87–5.11)
RDW: 13.5 % (ref 11.5–15.5)
WBC: 5.5 10*3/uL (ref 4.0–10.5)

## 2016-11-25 LAB — COMPREHENSIVE METABOLIC PANEL WITH GFR
ALT: 233 U/L — ABNORMAL HIGH (ref 0–35)
AST: 249 U/L — ABNORMAL HIGH (ref 0–37)
Albumin: 3.8 g/dL (ref 3.5–5.2)
Alkaline Phosphatase: 106 U/L (ref 39–117)
BUN: 10 mg/dL (ref 6–23)
CO2: 27 meq/L (ref 19–32)
Calcium: 9.5 mg/dL (ref 8.4–10.5)
Chloride: 104 meq/L (ref 96–112)
Creatinine, Ser: 0.75 mg/dL (ref 0.40–1.20)
GFR: 86.77 mL/min
Glucose, Bld: 126 mg/dL — ABNORMAL HIGH (ref 70–99)
Potassium: 4.5 meq/L (ref 3.5–5.1)
Sodium: 137 meq/L (ref 135–145)
Total Bilirubin: 0.9 mg/dL (ref 0.2–1.2)
Total Protein: 7.2 g/dL (ref 6.0–8.3)

## 2016-11-25 LAB — SEDIMENTATION RATE: Sed Rate: 63 mm/hr — ABNORMAL HIGH (ref 0–30)

## 2016-11-29 ENCOUNTER — Telehealth: Payer: Self-pay | Admitting: Physician Assistant

## 2016-11-29 ENCOUNTER — Other Ambulatory Visit: Payer: Self-pay

## 2016-11-29 DIAGNOSIS — G9331 Postviral fatigue syndrome: Secondary | ICD-10-CM

## 2016-11-29 DIAGNOSIS — G933 Postviral fatigue syndrome: Secondary | ICD-10-CM

## 2016-11-29 DIAGNOSIS — R945 Abnormal results of liver function studies: Secondary | ICD-10-CM

## 2016-11-29 DIAGNOSIS — R7401 Elevation of levels of liver transaminase levels: Secondary | ICD-10-CM

## 2016-11-29 DIAGNOSIS — R740 Nonspecific elevation of levels of transaminase and lactic acid dehydrogenase [LDH]: Secondary | ICD-10-CM

## 2016-11-29 NOTE — Telephone Encounter (Signed)
See lab results. Patient is advised and agrees to the plan of care.

## 2016-12-01 ENCOUNTER — Other Ambulatory Visit (INDEPENDENT_AMBULATORY_CARE_PROVIDER_SITE_OTHER): Payer: 59

## 2016-12-01 DIAGNOSIS — K7689 Other specified diseases of liver: Secondary | ICD-10-CM

## 2016-12-01 DIAGNOSIS — R740 Nonspecific elevation of levels of transaminase and lactic acid dehydrogenase [LDH]: Secondary | ICD-10-CM | POA: Diagnosis not present

## 2016-12-01 DIAGNOSIS — R945 Abnormal results of liver function studies: Secondary | ICD-10-CM

## 2016-12-01 DIAGNOSIS — R7401 Elevation of levels of liver transaminase levels: Secondary | ICD-10-CM

## 2016-12-01 LAB — CK: Total CK: 32 U/L (ref 7–177)

## 2016-12-01 LAB — IBC PANEL
IRON: 69 ug/dL (ref 42–145)
SATURATION RATIOS: 16.2 % — AB (ref 20.0–50.0)
TRANSFERRIN: 305 mg/dL (ref 212.0–360.0)

## 2016-12-01 LAB — FERRITIN: Ferritin: 238 ng/mL (ref 10.0–291.0)

## 2016-12-02 LAB — LYME AB/WESTERN BLOT REFLEX
LYME DISEASE AB, QUANT, IGM: <0.8 index (ref 0.00–0.79)
Lyme IgG/IgM Ab: <0.91 {ISR} (ref 0.00–0.90)

## 2016-12-02 LAB — ALPHA-1-ANTITRYPSIN: A1 ANTITRYPSIN SER: 160 mg/dL (ref 83–199)

## 2016-12-02 LAB — CERULOPLASMIN: CERULOPLASMIN: 34 mg/dL (ref 18–53)

## 2016-12-02 LAB — IGG: IgG (Immunoglobin G), Serum: 1416 mg/dL (ref 694–1618)

## 2016-12-10 ENCOUNTER — Telehealth: Payer: Self-pay | Admitting: Gastroenterology

## 2016-12-10 ENCOUNTER — Other Ambulatory Visit: Payer: Self-pay | Admitting: Obstetrics and Gynecology

## 2016-12-10 DIAGNOSIS — Z1231 Encounter for screening mammogram for malignant neoplasm of breast: Secondary | ICD-10-CM

## 2016-12-10 NOTE — Telephone Encounter (Signed)
Yes, Ok to get flu vaccine

## 2016-12-10 NOTE — Telephone Encounter (Signed)
Is it okay for patient to get flu vaccine?

## 2016-12-10 NOTE — Telephone Encounter (Signed)
Patient advised.

## 2016-12-21 ENCOUNTER — Telehealth: Payer: Self-pay | Admitting: Physician Assistant

## 2016-12-21 ENCOUNTER — Ambulatory Visit (INDEPENDENT_AMBULATORY_CARE_PROVIDER_SITE_OTHER): Payer: 59 | Admitting: Family Medicine

## 2016-12-21 ENCOUNTER — Other Ambulatory Visit: Payer: Self-pay

## 2016-12-21 VITALS — BP 118/81 | HR 84 | Temp 97.1°F | Ht 64.0 in | Wt 261.0 lb

## 2016-12-21 DIAGNOSIS — B179 Acute viral hepatitis, unspecified: Secondary | ICD-10-CM

## 2016-12-21 DIAGNOSIS — G8929 Other chronic pain: Secondary | ICD-10-CM | POA: Diagnosis not present

## 2016-12-21 DIAGNOSIS — R350 Frequency of micturition: Secondary | ICD-10-CM

## 2016-12-21 DIAGNOSIS — M545 Low back pain: Secondary | ICD-10-CM

## 2016-12-21 LAB — URINALYSIS
Bilirubin, UA: NEGATIVE
Glucose, UA: NEGATIVE
Ketones, UA: NEGATIVE
LEUKOCYTES UA: NEGATIVE
Nitrite, UA: NEGATIVE
PH UA: 7.5 (ref 5.0–7.5)
PROTEIN UA: NEGATIVE
RBC, UA: NEGATIVE
SPEC GRAV UA: 1.015 (ref 1.005–1.030)
Urobilinogen, Ur: 1 mg/dL (ref 0.2–1.0)

## 2016-12-21 NOTE — Telephone Encounter (Signed)
Yes, please get CBC CMET  INR, and ESR

## 2016-12-21 NOTE — Progress Notes (Signed)
   HPI  Patient presents today here with back pain and urinary frequency.  Patient states that she had a period of time where she was not as active over the summer due to illness. Over the last 2 months or so she's had persistent dull achy bilateral low back pain in the paraspinal muscles. She states that she has had urinary frequency and occasional odor but denies any dysuria.  She denies fever, chills, sweats.  She's tolerating food and fluid like usual. She does not have any limitation of her daily function  PMH: Smoking status noted ROS: Per HPI  Objective: BP 118/81   Pulse 84   Temp (!) 97.1 F (36.2 C) (Oral)   Ht 5\' 4"  (1.626 m)   Wt 261 lb (118.4 kg)   BMI 44.80 kg/m  Gen: NAD, alert, cooperative with exam HEENT: NCAT, CV: RRR, good S1/S2, no murmur Resp: CTABL, no wheezes, non-labored Abd: No suprapubic tenderness, no CVA tenderness, positive bowel sounds Ext: No edema, warm Neuro: Alert and oriented, strength 5/5 in bilateral lower extremities, 2+ patellar tendon reflexes MSK Mild tenderness to palpation of paraspinal muscles in bilateral lumbar spine  Assessment and plan:  # Chronic bilateral low back pain without sciatica Patient now with pain with for about 2 months. Recommended plain films to rule out any bony abnormality and physical therapy, most likely due to deconditioning. NSAIDs are okay, we reviewed their appropriate medicine  # Frequency of micturition Urinalysis is reassuring, no discrete signs of UTI. Urine culture   Orders Placed This Encounter  Procedures  . Urine Culture  . DG Lumbar Spine 2-3 Views    Standing Status:   Future    Standing Expiration Date:   02/20/2018    Order Specific Question:   Reason for Exam (SYMPTOM  OR DIAGNOSIS REQUIRED)    Answer:   low back pain    Order Specific Question:   Is the patient pregnant?    Answer:   No    Order Specific Question:   Preferred imaging location?    Answer:   Internal  .  Urinalysis  . Ambulatory referral to Physical Therapy    Referral Priority:   Routine    Referral Type:   Physical Medicine    Referral Reason:   Specialty Services Required    Requested Specialty:   Physical Therapy    Number of Visits Requested:   1    No orders of the defined types were placed in this encounter.   Laroy Apple, MD [facility] 12/21/2016, 5:50 PM

## 2016-12-21 NOTE — Patient Instructions (Signed)
Great to meet you!  Come back or call with any concerns  We will culture your urine carefully with a culture   Come back for an x ray, we will call with results.   I have ordered physical therapy.

## 2016-12-21 NOTE — Telephone Encounter (Signed)
Low dull ache in her back right above her hips. She admits she has a low PO fluid intake. Her caffeine intake has increased since returning to work. She noted some vaginal discharge. No fevers, chills or nausea. She will call her PCP to discuss her symptoms.  Her follow up GI appointment is 01/20/17. Do you want any follow up labs prior to the appointment?

## 2016-12-23 LAB — URINE CULTURE

## 2016-12-24 ENCOUNTER — Ambulatory Visit: Payer: 59

## 2016-12-24 ENCOUNTER — Other Ambulatory Visit (INDEPENDENT_AMBULATORY_CARE_PROVIDER_SITE_OTHER): Payer: 59

## 2016-12-24 DIAGNOSIS — G8929 Other chronic pain: Secondary | ICD-10-CM | POA: Diagnosis not present

## 2016-12-24 DIAGNOSIS — M545 Low back pain: Secondary | ICD-10-CM | POA: Diagnosis not present

## 2017-01-04 DIAGNOSIS — L03031 Cellulitis of right toe: Secondary | ICD-10-CM | POA: Diagnosis not present

## 2017-01-04 DIAGNOSIS — M79674 Pain in right toe(s): Secondary | ICD-10-CM | POA: Diagnosis not present

## 2017-01-06 ENCOUNTER — Ambulatory Visit: Payer: 59

## 2017-01-18 ENCOUNTER — Other Ambulatory Visit (INDEPENDENT_AMBULATORY_CARE_PROVIDER_SITE_OTHER): Payer: 59

## 2017-01-18 DIAGNOSIS — B179 Acute viral hepatitis, unspecified: Secondary | ICD-10-CM | POA: Diagnosis not present

## 2017-01-18 DIAGNOSIS — L03031 Cellulitis of right toe: Secondary | ICD-10-CM | POA: Diagnosis not present

## 2017-01-18 LAB — CBC WITH DIFFERENTIAL/PLATELET
BASOS PCT: 1.2 % (ref 0.0–3.0)
Basophils Absolute: 0.1 10*3/uL (ref 0.0–0.1)
EOS PCT: 3.3 % (ref 0.0–5.0)
Eosinophils Absolute: 0.2 10*3/uL (ref 0.0–0.7)
HEMATOCRIT: 40.6 % (ref 36.0–46.0)
HEMOGLOBIN: 13.6 g/dL (ref 12.0–15.0)
LYMPHS PCT: 38.4 % (ref 12.0–46.0)
Lymphs Abs: 2.6 10*3/uL (ref 0.7–4.0)
MCHC: 33.4 g/dL (ref 30.0–36.0)
MCV: 90.4 fl (ref 78.0–100.0)
Monocytes Absolute: 0.4 10*3/uL (ref 0.1–1.0)
Monocytes Relative: 5.6 % (ref 3.0–12.0)
Neutro Abs: 3.5 10*3/uL (ref 1.4–7.7)
Neutrophils Relative %: 51.5 % (ref 43.0–77.0)
Platelets: 218 10*3/uL (ref 150.0–400.0)
RBC: 4.49 Mil/uL (ref 3.87–5.11)
RDW: 13.4 % (ref 11.5–15.5)
WBC: 6.8 10*3/uL (ref 4.0–10.5)

## 2017-01-18 LAB — COMPREHENSIVE METABOLIC PANEL
ALBUMIN: 3.8 g/dL (ref 3.5–5.2)
ALT: 84 U/L — ABNORMAL HIGH (ref 0–35)
AST: 69 U/L — ABNORMAL HIGH (ref 0–37)
Alkaline Phosphatase: 77 U/L (ref 39–117)
BUN: 13 mg/dL (ref 6–23)
CALCIUM: 9.6 mg/dL (ref 8.4–10.5)
CHLORIDE: 102 meq/L (ref 96–112)
CO2: 30 mEq/L (ref 19–32)
Creatinine, Ser: 0.64 mg/dL (ref 0.40–1.20)
GFR: 104.13 mL/min (ref >60.00)
Glucose, Bld: 181 mg/dL — ABNORMAL HIGH (ref 70–99)
POTASSIUM: 3.8 meq/L (ref 3.5–5.1)
SODIUM: 138 meq/L (ref 135–145)
Total Bilirubin: 0.8 mg/dL (ref 0.2–1.2)
Total Protein: 7.2 g/dL (ref 6.0–8.3)

## 2017-01-18 LAB — PROTIME-INR
INR: 1.1 ratio — AB (ref 0.8–1.0)
Prothrombin Time: 12.4 s (ref 9.6–13.1)

## 2017-01-18 LAB — SEDIMENTATION RATE: Sed Rate: 31 mm/hr — ABNORMAL HIGH (ref 0–30)

## 2017-01-20 ENCOUNTER — Ambulatory Visit: Payer: 59 | Admitting: Gastroenterology

## 2017-01-20 ENCOUNTER — Other Ambulatory Visit (INDEPENDENT_AMBULATORY_CARE_PROVIDER_SITE_OTHER): Payer: 59

## 2017-01-20 ENCOUNTER — Encounter: Payer: Self-pay | Admitting: Gastroenterology

## 2017-01-20 VITALS — BP 124/72 | HR 82 | Ht 63.5 in | Wt 260.0 lb

## 2017-01-20 DIAGNOSIS — Z1211 Encounter for screening for malignant neoplasm of colon: Secondary | ICD-10-CM

## 2017-01-20 DIAGNOSIS — R7309 Other abnormal glucose: Secondary | ICD-10-CM

## 2017-01-20 DIAGNOSIS — R935 Abnormal findings on diagnostic imaging of other abdominal regions, including retroperitoneum: Secondary | ICD-10-CM

## 2017-01-20 DIAGNOSIS — K760 Fatty (change of) liver, not elsewhere classified: Secondary | ICD-10-CM

## 2017-01-20 DIAGNOSIS — B179 Acute viral hepatitis, unspecified: Secondary | ICD-10-CM | POA: Diagnosis not present

## 2017-01-20 LAB — HEMOGLOBIN A1C: HEMOGLOBIN A1C: 6.9 % — AB (ref 4.6–6.5)

## 2017-01-20 MED ORDER — SUPREP BOWEL PREP KIT 17.5-3.13-1.6 GM/177ML PO SOLN
ORAL | 0 refills | Status: DC
Start: 2017-01-20 — End: 2017-02-01

## 2017-01-20 NOTE — Progress Notes (Signed)
HPI :  51 year old female here for a follow up visit.  She was seen initially in our office by Nicoletta Ba with malaise, fatigue, myalgias, low grade fever, poor appetite. Rather abrupt onset of symptoms. She had an associated elevation in liver enzymes with this - previous baseline was 4 years ago, patient had normal LFTs. She then had transaminitis with ALT and AST that peaked in the 200s.  She had an extensive serologic workup performed, remarkable for markedly positive EBV and CMV IgM and IgG. She had a mildly positive SMA but negative ANA and IgG. Hep A / B / C negative. CT of the abdomen and pelvis on 11/02/2016 which did show fatty changes in her liver, no evidence of splenomegaly and had some slightly prominent lymph nodes adjacent to the body of the pancreas likely reactive in nature  Over time her symptoms have significantly improved. She states she feels "100%" better. Fatigue was her main issue that bothered her but this has slowly improved. Myalgias resolved. She denies any abdominal pains, no changes in her bowels. She reports having gained 30 lbs over the past 1-2 years but lost 22 lbs during this illness. She has since regained 10 lbs.   In regards to her fatty liver noted on CT, she denies any significant alcohol use. She drinks coffee routinely. She denies a FH of cirrhosis. Her glucose levels have been elevated on recent blood draw. Her last LFTs showed improvement in transaminitis but not resolution.     Past Medical History:  Diagnosis Date  . Arthritis   . GERD (gastroesophageal reflux disease)   . Mononucleosis   . Seasonal allergies   . Viral hepatitis 10/2016     Past Surgical History:  Procedure Laterality Date  . TUBAL LIGATION  2004   History reviewed. No pertinent family history. Social History   Tobacco Use  . Smoking status: Never Smoker  . Smokeless tobacco: Never Used  Substance Use Topics  . Alcohol use: Yes    Comment: occasional  . Drug use:  No   Current Outpatient Medications  Medication Sig Dispense Refill  . cetirizine (ZYRTEC) 10 MG tablet Take 10 mg by mouth daily.    Marland Kitchen esomeprazole (NEXIUM) 40 MG capsule Take 1 capsule daily. 30 capsule 1  . famotidine (PEPCID) 10 MG tablet Take 10 mg by mouth daily.    Marland Kitchen ibuprofen (ADVIL,MOTRIN) 200 MG tablet Take 200 mg by mouth as needed for pain.    . Multiple Vitamin (MULTIVITAMIN) tablet Take 1 tablet daily by mouth.    . Probiotic Product (ALIGN PO) Take daily by mouth.    . escitalopram (LEXAPRO) 10 MG tablet Take 10 mg by mouth daily.    Marland Kitchen glucosamine-chondroitin 500-400 MG tablet Take 1 tablet by mouth daily.    . ondansetron (ZOFRAN-ODT) 8 MG disintegrating tablet Take 8 mg by mouth every 8 (eight) hours as needed.  0   No current facility-administered medications for this visit.    No Known Allergies   Review of Systems: All systems reviewed and negative except where noted in HPI.    Dg Lumbar Spine 2-3 Views  Result Date: 12/24/2016 CLINICAL DATA:  Chronic low back pain without known injury. EXAM: LUMBAR SPINE - 2-3 VIEW COMPARISON:  CT scan of November 02, 2016. FINDINGS: No fracture or significant spondylolisthesis is noted. Disc spaces are well-maintained. Good alignment of vertebral bodies is noted. IMPRESSION: No significant abnormality seen in the lumbar spine. Electronically Signed   By:  Marijo Conception, M.D.   On: 12/24/2016 16:47   Lab Results  Component Value Date   ALT 84 (H) 01/18/2017   AST 69 (H) 01/18/2017   ALKPHOS 77 01/18/2017   BILITOT 0.8 01/18/2017    Lab Results  Component Value Date   WBC 6.8 01/18/2017   HGB 13.6 01/18/2017   HCT 40.6 01/18/2017   MCV 90.4 01/18/2017   PLT 218.0 01/18/2017    Lab Results  Component Value Date   CREATININE 0.64 01/18/2017   BUN 13 01/18/2017   NA 138 01/18/2017   K 3.8 01/18/2017   CL 102 01/18/2017   CO2 30 01/18/2017      Physical Exam: BP 124/72   Pulse 82   Ht 5' 3.5" (1.613 m)   Wt  260 lb (117.9 kg)   BMI 45.33 kg/m  Constitutional: Pleasant,well-developed, female in no acute distress. HEENT: Normocephalic and atraumatic. Conjunctivae are normal. No scleral icterus. Neck supple.  Cardiovascular: Normal rate, regular rhythm.  Pulmonary/chest: Effort normal and breath sounds normal. No wheezing, rales or rhonchi. Abdominal: Soft, protuberant, nontender. . There are no masses palpable. No hepatomegaly. Extremities: no edema Lymphadenopathy: No cervical adenopathy noted. Neurological: Alert and oriented to person place and time. Skin: Skin is warm and dry. No rashes noted. Psychiatric: Normal mood and affect. Behavior is normal.   ASSESSMENT AND PLAN: 51 year old female here for reassessment of the following issues:  Viral hepatitis - patient had an acute viral illness associated with transaminitis. She had markedly positive serologies for acute EBV and CMV infection - it took some time but she eventually had complete resolution of her initial symptoms, her transaminitis is improving, and fatigue slowly improving. She did not require any antiviral therapy. We discussed her course, I expect she will continue to improve she recovers from this. I will plan on repeating her LFTs in another 4-6 weeks to see if a normalize. Her prior baseline LFTs were for years prior to the onset of this issue. Given her fatty liver and possible she has baseline mild elevation in transaminases. She agreed with the plan.  Fatty liver / morbid obesity - we discussed this issue at length. I discussed the spectrum of fatty liver disease with long-term risks for Karlene Lineman and cirrhosis. Of note her glucose levels have been elevated on recent lab draws, I'm recommending we test her for diabetes with a hemoglobin A1c. I otherwise discussed the importance of diet and weight loss for her, she will try to work on this. Otherwise routine coffee ingestion is recommended to help prevent fibrotic changes, which she  is already doing.   Elevated glucose - as above, will test for diabetes  Abnormal CT abdomen - likely reactive lymph nodes around the pancreas. She is rather anxious about this finding. I reassured her her pancreas otherwise looks normal and I doubt any evidence of malignancy, this is very likely reactive to her viral process, however she is requesting some follow-up imaging for this. Per radiology will obtain repeat imaging 3-6 months after her CT scan. I would favor doing this around 5-6 months to allow time for this likely reactive lymph node to resolve.   Colon cancer screening - patient is at average risk for colon cancer screening and is due for routine colon cancer screening. I discussed options for her to include stool based testing and optical colonoscopy. Following discussion of risks and benefits of each she wanted to proceed with optical colonoscopy. We will schedule this for December,  further recommendations pending the results.  Cowlic Cellar, MD [facility] Pager [phone]

## 2017-01-20 NOTE — Patient Instructions (Addendum)
If you are age 50 or older, your body mass index should be between 23-30. Your Body mass index is 45.33 kg/m. If this is out of the aforementioned range listed, please consider follow up with your Primary Care Provider.  If you are age 29 or younger, your body mass index should be between 19-25. Your Body mass index is 45.33 kg/m. If this is out of the aformentioned range listed, please consider follow up with your Primary Care Provider.  You have been scheduled for a colonoscopy. Please follow written instructions given to you at your visit today.  Please pick up your prep supplies at the pharmacy within the next 1-3 days. If you use inhalers (even only as needed), please bring them with you on the day of your procedure. Your physician has requested that you go to www.startemmi.com and enter the access code given to you at your visit today. This web site gives a general overview about your procedure. However, you should still follow specific instructions given to you by our office regarding your preparation for the procedure.  Your physician has requested that you go to the basement for the following lab work before leaving today: A1C  You will be due for a repeat LFT in 4 to 6 weeks.  We will remind you when it is time.   Thank you.

## 2017-01-21 ENCOUNTER — Other Ambulatory Visit: Payer: Self-pay

## 2017-01-21 MED ORDER — METFORMIN HCL 500 MG PO TABS: 500.0000 mg | ORAL_TABLET | Freq: Two times a day (BID) | ORAL | 1 refills | Status: DC

## 2017-01-24 ENCOUNTER — Ambulatory Visit
Admission: RE | Admit: 2017-01-24 | Discharge: 2017-01-24 | Disposition: A | Discharge status: Home / Self Care | Payer: 59 | Source: Ambulatory Visit | Attending: Obstetrics and Gynecology | Admitting: Obstetrics and Gynecology

## 2017-01-24 DIAGNOSIS — Z1231 Encounter for screening mammogram for malignant neoplasm of breast: Secondary | ICD-10-CM | POA: Diagnosis not present

## 2017-01-31 ENCOUNTER — Ambulatory Visit: Payer: 59 | Admitting: Family Medicine

## 2017-02-01 ENCOUNTER — Encounter: Payer: Self-pay | Admitting: Family Medicine

## 2017-02-01 ENCOUNTER — Ambulatory Visit (INDEPENDENT_AMBULATORY_CARE_PROVIDER_SITE_OTHER): Payer: 59 | Admitting: Family Medicine

## 2017-02-01 VITALS — BP 121/73 | HR 81 | Temp 97.0°F | Ht 63.5 in | Wt 259.0 lb

## 2017-02-01 DIAGNOSIS — E119 Type 2 diabetes mellitus without complications: Secondary | ICD-10-CM

## 2017-02-01 MED ORDER — GLUCOSE BLOOD VI STRP: ORAL_STRIP | 12 refills | Status: DC

## 2017-02-01 MED ORDER — BLOOD GLUCOSE MONITOR SYSTEM W/DEVICE KIT: 1.0000 | PACK | Freq: Two times a day (BID) | 0 refills | Status: DC

## 2017-02-01 MED ORDER — LANCETS MISC: 3 refills | Status: DC

## 2017-02-01 NOTE — Patient Instructions (Signed)

## 2017-02-01 NOTE — Progress Notes (Signed)
Subjective:  Patient ID: CHEROLYN BEHRLE, female    DOB: 21-May-1965  Age: 51 y.o. MRN: [id number]  CC: Diabetes (pt here today for Hgb A1C that was 6.9 on 01/20/17 and she was started on Metformin 577m BID by gastro and here today to discuss new onset of diabetes. Pt does have a history of gestational diabetes.)   HPI WABBEGAYLE DENAULTpresents for consultation regarding new onset DM. First noted in April with fasting glucose of 137.  She was told that it was just a little high.  However she recently was seen by GI and found to have an A1c of 6.9.She has no polyuria or polydipsia. She is not checking glucose. She had gestational diabetes 20 years ago. Has some memories of that regimen, but no clear recollections.  [facility] 12/21/2016 10/25/2016 10/14/2016  Decreased Interest 0 0 0  Down, Depressed, Hopeless 0 0 0  PHQ - 2 Score 0 0 0    History WChloeyhas a past medical history of Arthritis, GERD (gastroesophageal reflux disease), Mononucleosis, Seasonal allergies, and Viral hepatitis (10/2016).   She has a past surgical history that includes Tubal ligation (2004) and Carpal tunnel release (Left, 12/05/2012).   Her family history is not on file.She reports that  has never smoked. she has never used smokeless tobacco. She reports that she drinks alcohol. She reports that she does not use drugs.    ROS Review of Systems Noncontributory except as per HPI Objective:  BP 121/73   Pulse 81   Temp (!) 97 F (36.1 C) (Oral)   Ht 5' 3.5" (1.613 m)   Wt 259 lb (117.5 kg)   BMI 45.16 kg/m   BP Readings from Last 3 Encounters:  02/01/17 121/73  01/20/17 124/72  12/21/16 118/81    Wt Readings from Last 3 Encounters:  02/01/17 259 lb (117.5 kg)  01/20/17 260 lb (117.9 kg)  12/21/16 261 lb (118.4 kg)     Physical Exam  Constitutional: She is oriented to person, place, and time. She appears well-developed and well-nourished. No distress.  Cardiovascular: Normal rate and  regular rhythm.  Pulmonary/Chest: Breath sounds normal.  Neurological: She is alert and oriented to person, place, and time.  Skin: Skin is warm and dry.  Psychiatric: She has a normal mood and affect.      Assessment & Plan:   WReeannawas seen today for diabetes.  Diagnoses and all orders for this visit:  Type 2 diabetes mellitus without complication, without long-term current use of insulin ([place]     Referral to Nutrition and Diabetes Services  Other orders -     Blood Glucose Monitoring Suppl (BLOOD GLUCOSE MONITOR SYSTEM) w/Device KIT; 1 Device 2 (two) times daily by Does not apply route. -     Lancets MISC; Use to test blood sugar twice daily. -     glucose blood test strip; Use as instructed   Over 30 minutes was spent this with this patient.  Greater than one half was spent in consultation and counseling regarding diabetic care.  This included carbohydrate control exercise regimen carb counting exercise etc.    I have discontinued WGloriann Loan Fye's SUPREP BOWEL PREP KIT. I am also having her start on Blood Glucose Monitor System, Lancets, and glucose blood. Additionally, I am having her maintain her glucosamine-chondroitin, cetirizine, ibuprofen, escitalopram, famotidine, ondansetron, esomeprazole, Probiotic Product (ALIGN PO), multivitamin, and metFORMIN.  Allergies as of 02/01/2017   No Known Allergies  Medication List        Accurate as of 02/01/17  8:50 PM. Always use your most recent med list.          ALIGN PO Take daily by mouth.   Blood Glucose Monitor System w/Device Kit 1 Device 2 (two) times daily by Does not apply route.   cetirizine 10 MG tablet Commonly known as:  ZYRTEC Take 10 mg by mouth daily.   escitalopram 10 MG tablet Commonly known as:  LEXAPRO Take 10 mg by mouth daily.   esomeprazole 40 MG capsule Commonly known as:  NEXIUM Take 1 capsule daily.   famotidine 10 MG tablet Commonly known as:  PEPCID Take 10 mg by mouth  daily.   glucosamine-chondroitin 500-400 MG tablet Take 1 tablet by mouth daily.   glucose blood test strip Use as instructed   ibuprofen 200 MG tablet Commonly known as:  ADVIL,MOTRIN Take 200 mg by mouth as needed for pain.   Lancets Misc Use to test blood sugar twice daily.   metFORMIN 500 MG tablet Commonly known as:  GLUCOPHAGE Take 1 tablet (500 mg total) 2 (two) times daily with a meal by mouth.   multivitamin tablet Take 1 tablet daily by mouth.   ondansetron 8 MG disintegrating tablet Commonly known as:  ZOFRAN-ODT Take 8 mg by mouth every 8 (eight) hours as needed.        Follow-up: Return in about 3 months (around 05/04/2017).  Claretta Fraise, M.D.

## 2017-02-09 ENCOUNTER — Other Ambulatory Visit: Payer: Self-pay

## 2017-02-09 ENCOUNTER — Ambulatory Visit (AMBULATORY_SURGERY_CENTER): Payer: 59 | Admitting: Gastroenterology

## 2017-02-09 ENCOUNTER — Encounter: Payer: Self-pay | Admitting: Gastroenterology

## 2017-02-09 VITALS — BP 119/69 | HR 76 | Temp 98.9°F | Resp 19 | Ht 63.5 in | Wt 260.0 lb

## 2017-02-09 DIAGNOSIS — K760 Fatty (change of) liver, not elsewhere classified: Secondary | ICD-10-CM | POA: Diagnosis not present

## 2017-02-09 DIAGNOSIS — D120 Benign neoplasm of cecum: Secondary | ICD-10-CM

## 2017-02-09 DIAGNOSIS — K635 Polyp of colon: Secondary | ICD-10-CM | POA: Diagnosis not present

## 2017-02-09 DIAGNOSIS — Z1211 Encounter for screening for malignant neoplasm of colon: Secondary | ICD-10-CM

## 2017-02-09 DIAGNOSIS — Z1212 Encounter for screening for malignant neoplasm of rectum: Secondary | ICD-10-CM

## 2017-02-09 DIAGNOSIS — D122 Benign neoplasm of ascending colon: Secondary | ICD-10-CM

## 2017-02-09 DIAGNOSIS — R933 Abnormal findings on diagnostic imaging of other parts of digestive tract: Secondary | ICD-10-CM | POA: Diagnosis not present

## 2017-02-09 DIAGNOSIS — K219 Gastro-esophageal reflux disease without esophagitis: Secondary | ICD-10-CM | POA: Diagnosis not present

## 2017-02-09 MED ORDER — SODIUM CHLORIDE 0.9 % IV SOLN: 500.0000 mL | INTRAVENOUS | Status: DC

## 2017-02-09 NOTE — Progress Notes (Signed)
Called to room to assist during endoscopic procedure.  Patient ID and intended procedure confirmed with present staff. Received instructions for my participation in the procedure from the performing physician.Called to room to assist during endoscopic procedure.   

## 2017-02-09 NOTE — Patient Instructions (Signed)
**   Handouts given on polyps and diverticulosis ** No NSAIDS (ibuprofen, aleve, naproxen) for two weeks!! Tylenol is okay to take!   YOU HAD AN ENDOSCOPIC PROCEDURE TODAY AT [facility]:   Refer to the procedure report that was given to you for any specific questions about what was found during the examination.  If the procedure report does not answer your questions, please call your gastroenterologist to clarify.  If you requested that your care partner not be given the details of your procedure findings, then the procedure report has been included in a sealed envelope for you to review at your convenience later.  YOU SHOULD EXPECT: Some feelings of bloating in the abdomen. Passage of more gas than usual.  Walking can help get rid of the air that was put into your GI tract during the procedure and reduce the bloating. If you had a lower endoscopy (such as a colonoscopy or flexible sigmoidoscopy) you may notice spotting of blood in your stool or on the toilet paper. If you underwent a bowel prep for your procedure, you may not have a normal bowel movement for a few days.  Please Note:  You might notice some irritation and congestion in your nose or some drainage.  This is from the oxygen used during your procedure.  There is no need for concern and it should clear up in a day or so.  SYMPTOMS TO REPORT IMMEDIATELY:   Following lower endoscopy (colonoscopy or flexible sigmoidoscopy):  Excessive amounts of blood in the stool  Significant tenderness or worsening of abdominal pains  Swelling of the abdomen that is new, acute  Fever of 100F or higher   For urgent or emergent issues, a gastroenterologist can be reached at any hour by calling [phone].   DIET:  We do recommend a small meal at first, but then you may proceed to your regular diet.  Drink plenty of fluids but you should avoid alcoholic beverages for 24 hours.  ACTIVITY:  You should plan to take it easy for the  rest of today and you should NOT DRIVE or use heavy machinery until tomorrow (because of the sedation medicines used during the test).    FOLLOW UP: Our staff will call the number listed on your records the next business day following your procedure to check on you and address any questions or concerns that you may have regarding the information given to you following your procedure. If we do not reach you, we will leave a message.  However, if you are feeling well and you are not experiencing any problems, there is no need to return our call.  We will assume that you have returned to your regular daily activities without incident.  If any biopsies were taken you will be contacted by phone or by letter within the next 1-3 weeks.  Please call us at [phone] if you have not heard about the biopsies in 3 weeks.    SIGNATURES/CONFIDENTIALITY: You and/or your care partner have signed paperwork which will be entered into your electronic medical record.  These signatures attest to the fact that that the information above on your After Visit Summary has been reviewed and is understood.  Full responsibility of the confidentiality of this discharge information lies with you and/or your care-partner.

## 2017-02-09 NOTE — Progress Notes (Signed)
Report to PACU, RN, vss, BBS= Clear.  

## 2017-02-09 NOTE — Op Note (Signed)
[facility] Patient Name: Mary [name] Procedure Date: 02/09/2017 2:57 PM MRN: [id number] Endoscopist: Remo Lipps P. Armbruster MD, MD Age: 51 Referring MD:  Date of Birth: 1965-09-08 Gender: Female Account #: [id number] Procedure:                Colonoscopy Indications:              Screening for colorectal malignant neoplasm, This                            is the patient's first colonoscopy Medicines:                Monitored Anesthesia Care Procedure:                Pre-Anesthesia Assessment:                           - Prior to the procedure, a History and Physical                            was performed, and patient medications and                            allergies were reviewed. The patient's tolerance of                            previous anesthesia was also reviewed. The risks                            and benefits of the procedure and the sedation                            options and risks were discussed with the patient.                            All questions were answered, and informed consent                            was obtained. Prior Anticoagulants: The patient has                            taken no previous anticoagulant or antiplatelet                            agents. ASA Grade Assessment: III - A patient with                            severe systemic disease. After reviewing the risks                            and benefits, the patient was deemed in                            satisfactory condition to undergo the procedure.  After obtaining informed consent, the colonoscope                            was passed under direct vision. Throughout the                            procedure, the patient's blood pressure, pulse, and                            oxygen saturations were monitored continuously. The                            Colonoscope was introduced through the anus and                            advanced to the  the terminal ileum, with                            identification of the appendiceal orifice. The                            colonoscopy was performed without difficulty. The                            patient tolerated the procedure well. The quality                            of the bowel preparation was good. The terminal                            ileum, the appendiceal orifice and the rectum were                            photographed. Scope In: 3:20:24 PM Scope Out: 3:36:16 PM Scope Withdrawal Time: 0 hours 13 minutes 22 seconds  Total Procedure Duration: 0 hours 15 minutes 52 seconds  Findings:                 The perianal and digital rectal examinations were                            normal.                           The terminal ileum appeared normal.                           A 5 mm polyp was found in the cecum. The polyp was                            sessile. The polyp was removed with a cold snare.                            Resection and retrieval were complete.  A 3 mm polyp was found in the ascending colon. The                            polyp was sessile. The polyp was removed with a                            cold snare. Resection and retrieval were complete.                           A 7 mm polyp was found in the ascending colon. The                            polyp was flat. The polyp was removed with a cold                            snare. Resection and retrieval were complete.                           A few medium-mouthed diverticula were found in the                            sigmoid colon and transverse colon.                           Internal hemorrhoids were found during retroflexion.                           The exam was otherwise without abnormality. Of                            note, I thought a photo of the IC valve was taken                            but it was not. Complications:            No immediate complications.  Estimated blood loss:                            Minimal. Estimated Blood Loss:     Estimated blood loss was minimal. Impression:               - The examined portion of the ileum was normal.                           - One 5 mm polyp in the cecum, removed with a cold                            snare. Resected and retrieved.                           - One 3 mm polyp in the ascending colon, removed  with a cold snare. Resected and retrieved.                           - One 7 mm polyp in the ascending colon, removed                            with a cold snare. Resected and retrieved.                           - Diverticulosis in the sigmoid colon and in the                            transverse colon.                           - Internal hemorrhoids.                           - The examination was otherwise normal. Recommendation:           - Patient has a contact number available for                            emergencies. The signs and symptoms of potential                            delayed complications were discussed with the                            patient. Return to normal activities tomorrow.                            Written discharge instructions were provided to the                            patient.                           - Resume previous diet.                           - Continue present medications.                           - Await pathology results.                           - Repeat colonoscopy is recommended for                            surveillance. The colonoscopy date will be                            determined after pathology results from today's                            exam become  available for review.                           - No ibuprofen, naproxen, or other non-steroidal                            anti-inflammatory drugs for 2 weeks after polyp                            removal. Remo Lipps P. Armbruster MD,  MD 02/09/2017 3:41:22 PM This report has been signed electronically.

## 2017-02-10 ENCOUNTER — Telehealth: Payer: Self-pay

## 2017-02-10 NOTE — Telephone Encounter (Signed)
  Follow up Call-  Call Mary [name] number 02/09/2017  Post procedure Call Mary [name] phone  # [phone]  Permission to leave phone message Yes  Some recent data might be hidden     Patient questions:  Do you have a fever, pain , or abdominal swelling? No. Pain Score  0 *  Have you tolerated food without any problems? Yes.    Have you been able to return to your normal activities? Yes.    Do you have any questions about your discharge instructions: Diet   No. Medications  No. Follow up visit  No.  Do you have questions or concerns about your Care? No.  Actions: * If pain score is 4 or above: No action needed, pain <4.

## 2017-02-17 ENCOUNTER — Other Ambulatory Visit: Payer: Self-pay | Admitting: Gastroenterology

## 2017-02-18 ENCOUNTER — Encounter: Payer: Self-pay | Admitting: Gastroenterology

## 2017-02-28 ENCOUNTER — Ambulatory Visit: Payer: 59 | Admitting: Registered"

## 2017-02-28 ENCOUNTER — Telehealth: Payer: Self-pay

## 2017-02-28 DIAGNOSIS — B179 Acute viral hepatitis, unspecified: Secondary | ICD-10-CM

## 2017-02-28 NOTE — Telephone Encounter (Signed)
Called and spoke to pt. Let her know she is due for repeat LFTs. Put order in and informed pt when the lab is open.  She expressed understanding.

## 2017-02-28 NOTE — Telephone Encounter (Signed)
-----   Message from Roetta Sessions, [place] sent at 01/20/2017  2:45 PM EST ----- Regarding: repeat LFTs due Pt due for repeat LFTs

## 2017-03-02 ENCOUNTER — Other Ambulatory Visit (INDEPENDENT_AMBULATORY_CARE_PROVIDER_SITE_OTHER): Payer: 59

## 2017-03-02 DIAGNOSIS — B179 Acute viral hepatitis, unspecified: Secondary | ICD-10-CM

## 2017-03-02 LAB — HEPATIC FUNCTION PANEL
ALBUMIN: 4 g/dL (ref 3.5–5.2)
ALK PHOS: 61 U/L (ref 39–117)
ALT: 56 U/L — AB (ref 0–35)
AST: 47 U/L — AB (ref 0–37)
Bilirubin, Direct: 0.1 mg/dL (ref 0.0–0.3)
Total Bilirubin: 0.5 mg/dL (ref 0.2–1.2)
Total Protein: 7.3 g/dL (ref 6.0–8.3)

## 2017-03-03 ENCOUNTER — Other Ambulatory Visit: Payer: Self-pay | Admitting: Gastroenterology

## 2017-03-03 DIAGNOSIS — R7989 Other specified abnormal findings of blood chemistry: Secondary | ICD-10-CM

## 2017-03-03 DIAGNOSIS — R945 Abnormal results of liver function studies: Principal | ICD-10-CM

## 2017-03-11 ENCOUNTER — Ambulatory Visit: Payer: 59 | Admitting: Registered"

## 2017-03-21 ENCOUNTER — Other Ambulatory Visit: Payer: Self-pay | Admitting: Nurse Practitioner

## 2017-03-21 NOTE — Telephone Encounter (Signed)
Informed patient that a prescription was sent in for her Metformin on 02/27/17 with one refill, so she should have one available.  She will contact [facility] and let us know if she has a problem.

## 2017-03-21 NOTE — Telephone Encounter (Signed)
What is the name of the medication? metFORMIN (GLUCOPHAGE) 500 MG tablet  Have you contacted your pharmacy to request a refill? yes  Which pharmacy would you like this sent to? [facility]    Patient notified that their request is being sent to the clinical staff for review and that they should receive a call once it is complete. If they do not receive a call within 24 hours they can check with their pharmacy or our office.

## 2017-03-25 ENCOUNTER — Ambulatory Visit: Payer: 59 | Admitting: Dietician

## 2017-04-20 ENCOUNTER — Telehealth: Payer: Self-pay | Admitting: Nurse Practitioner

## 2017-04-20 ENCOUNTER — Other Ambulatory Visit: Payer: Self-pay | Admitting: *Deleted

## 2017-04-20 DIAGNOSIS — R740 Nonspecific elevation of levels of transaminase and lactic acid dehydrogenase [LDH]: Secondary | ICD-10-CM

## 2017-04-20 DIAGNOSIS — R7401 Elevation of levels of liver transaminase levels: Secondary | ICD-10-CM

## 2017-04-20 DIAGNOSIS — Z780 Asymptomatic menopausal state: Secondary | ICD-10-CM | POA: Diagnosis not present

## 2017-04-20 DIAGNOSIS — Z01419 Encounter for gynecological examination (general) (routine) without abnormal findings: Secondary | ICD-10-CM | POA: Diagnosis not present

## 2017-04-20 DIAGNOSIS — E785 Hyperlipidemia, unspecified: Secondary | ICD-10-CM

## 2017-04-20 DIAGNOSIS — K219 Gastro-esophageal reflux disease without esophagitis: Secondary | ICD-10-CM

## 2017-04-20 DIAGNOSIS — E119 Type 2 diabetes mellitus without complications: Secondary | ICD-10-CM | POA: Insufficient documentation

## 2017-04-20 NOTE — Telephone Encounter (Signed)
Patient last visit was with Dr. Livia Snellen November 20,2018.  Please advise if any other labs should be ordered in addition to A1C.

## 2017-04-20 NOTE — Telephone Encounter (Signed)
Please order CMP DX transaminitis, CBC Dx GERD and lipid Dx hyperlipidemia in addition to A1c.

## 2017-04-20 NOTE — Telephone Encounter (Signed)
Aware. All labs ordered.

## 2017-04-22 ENCOUNTER — Other Ambulatory Visit: Payer: Self-pay | Admitting: Gastroenterology

## 2017-04-23 ENCOUNTER — Other Ambulatory Visit: Payer: 59

## 2017-04-23 DIAGNOSIS — R945 Abnormal results of liver function studies: Secondary | ICD-10-CM

## 2017-04-23 DIAGNOSIS — K219 Gastro-esophageal reflux disease without esophagitis: Secondary | ICD-10-CM

## 2017-04-23 DIAGNOSIS — E119 Type 2 diabetes mellitus without complications: Secondary | ICD-10-CM | POA: Diagnosis not present

## 2017-04-23 DIAGNOSIS — R7989 Other specified abnormal findings of blood chemistry: Secondary | ICD-10-CM

## 2017-04-23 DIAGNOSIS — E785 Hyperlipidemia, unspecified: Secondary | ICD-10-CM

## 2017-04-23 DIAGNOSIS — R7401 Elevation of levels of liver transaminase levels: Secondary | ICD-10-CM

## 2017-04-23 DIAGNOSIS — R740 Nonspecific elevation of levels of transaminase and lactic acid dehydrogenase [LDH]: Secondary | ICD-10-CM

## 2017-04-26 LAB — SPECIMEN STATUS REPORT

## 2017-04-26 LAB — HGB A1C W/O EAG: Hgb A1c MFr Bld: 5.7 % — ABNORMAL HIGH (ref 4.8–5.6)

## 2017-04-27 ENCOUNTER — Encounter: Payer: Self-pay | Admitting: Family Medicine

## 2017-04-27 ENCOUNTER — Ambulatory Visit: Payer: 59 | Admitting: Family Medicine

## 2017-04-27 VITALS — BP 118/76 | HR 66 | Temp 97.4°F | Ht 63.5 in | Wt 244.5 lb

## 2017-04-27 DIAGNOSIS — E119 Type 2 diabetes mellitus without complications: Secondary | ICD-10-CM

## 2017-04-27 MED ORDER — ATORVASTATIN CALCIUM 40 MG PO TABS: 40.0000 mg | ORAL_TABLET | Freq: Every day | ORAL | 1 refills | Status: DC

## 2017-04-27 NOTE — Progress Notes (Signed)
Subjective:  Patient ID: Mary [name], female    DOB: [birth year]  Age: 52 y.o. MRN: [id number]  CC: Diabetes (pt here today for routine follow up of her chronic medical conditions, no other concerns voiced)   HPI Mary [name] presents forFollow-up of diabetes. Patient checks blood sugar at home.  Glucose usually in the low 100s. Patient denies symptoms such as polyuria, polydipsia, excessive hunger, nausea No significant hypoglycemic spells noted. Medications reviewed. Pt reports taking them regularly without complication/adverse reaction being reported today.  Patient continues to diet carefully.  Mary [name] is lost 16 pounds in spite of this last.  Including most of the major holidays.  History Mary [name] has a past medical history of Allergy, Arthritis, GERD (gastroesophageal reflux disease), Mononucleosis, Seasonal allergies, and Viral hepatitis (10/2016).   Mary [name] has a past surgical history that includes Tubal ligation (2004) and Carpal tunnel release (Left, 12/05/2012).   Her family history includes Cancer (age of onset: 65) in her mother; Diabetes in her brother; Heart disease in her paternal grandmother.Mary [name] reports that  has never smoked. Mary [name] has never used smokeless tobacco. Mary [name] reports that Mary [name] drinks alcohol. Mary [name] reports that Mary [name] does not use drugs.  Current Outpatient Medications on File Prior to Visit  Medication Sig Dispense Refill  . Blood Glucose Monitoring Suppl (BLOOD GLUCOSE MONITOR SYSTEM) w/Device KIT 1 Device 2 (two) times daily by Does not apply route. 1 each 0  . cetirizine (ZYRTEC) 10 MG tablet Take 10 mg by mouth daily.    Marland Kitchen escitalopram (LEXAPRO) 10 MG tablet Take 10 mg by mouth daily.    . famotidine (PEPCID) 10 MG tablet Take 10 mg by mouth daily.    Marland Kitchen glucose blood test strip Use as instructed 100 each 12  . ibuprofen (ADVIL,MOTRIN) 200 MG tablet Take 200 mg by mouth as needed for pain.    . Lancets MISC Use to test blood sugar twice daily. 100 each 3  .  medroxyPROGESTERone (PROVERA) 10 MG tablet     . metFORMIN (GLUCOPHAGE) 500 MG tablet TAKE ONE TABLET TWICE A DAY WITH FOOD 60 tablet 1  . Multiple Vitamin (MULTIVITAMIN) tablet Take 1 tablet daily by mouth.     No current facility-administered medications on file prior to visit.     ROS Review of Systems  Constitutional: Negative for activity change, appetite change and fever.  HENT: Negative for congestion, rhinorrhea and sore throat.   Eyes: Negative for visual disturbance.  Respiratory: Negative for cough and shortness of breath.   Cardiovascular: Negative for chest pain and palpitations.  Gastrointestinal: Negative for abdominal pain, diarrhea and nausea.  Genitourinary: Negative for dysuria.  Musculoskeletal: Negative for arthralgias and myalgias.    Objective:  BP 118/76   Pulse 66   Temp (!) 97.4 F (36.3 C) (Oral)   Ht 5' 3.5" (1.613 m)   Wt 244 lb 8 oz (110.9 kg)   BMI 42.63 kg/m   BP Readings from Last 3 Encounters:  04/27/17 118/76  02/09/17 119/69  02/01/17 121/73    Wt Readings from Last 3 Encounters:  04/27/17 244 lb 8 oz (110.9 kg)  02/09/17 260 lb (117.9 kg)  02/01/17 259 lb (117.5 kg)     Physical Exam  Constitutional: Mary [name] is oriented to person, place, and time. Mary [name] appears well-developed and well-nourished. No distress.  HENT:  Head: Normocephalic and atraumatic.  Right Ear: External ear normal.  Left Ear: External ear normal.  Nose: Nose normal.  Mouth/Throat: Oropharynx is clear and  moist.  Eyes: Conjunctivae and EOM are normal. Pupils are equal, round, and reactive to light.  Neck: Normal range of motion. Neck supple. No thyromegaly present.  Cardiovascular: Normal rate, regular rhythm and normal heart sounds.  No murmur heard. Pulmonary/Chest: Effort normal and breath sounds normal. No respiratory distress. Mary [name] has no wheezes. Mary [name] has no rales.  Abdominal: Soft. Bowel sounds are normal. Mary [name] exhibits no distension. There is no tenderness.    Lymphadenopathy:    Mary [name] has no cervical adenopathy.  Neurological: Mary [name] is alert and oriented to person, place, and time. Mary [name] has normal reflexes.  Skin: Skin is warm and dry.  Psychiatric: Mary [name] has a normal mood and affect. Her behavior is normal. Judgment and thought content normal.    No components found for: BAYER     Assessment & Plan:   Vantasia was seen today for diabetes.  Diagnoses and all orders for this visit:  Type 2 diabetes mellitus without complication, without long-term current use of insulin (HCC) -     CMP14+EGFR; Future  Other orders -     atorvastatin (LIPITOR) 40 MG tablet; Take 1 tablet (40 mg total) by mouth daily.    Please check your blood sugars twice daily, once in the morning fasting (before eating or drinking anything) and then check your blood sugar 2 hours after eating your largest meal of the day. Record both readings and bring them to your next office visit.  I have discontinued Kip Kautzman. Cinelli's glucosamine-chondroitin, ondansetron, esomeprazole, and Probiotic Product (ALIGN PO). I am also having her start on atorvastatin. Additionally, I am having her maintain her cetirizine, ibuprofen, escitalopram, famotidine, multivitamin, Blood Glucose Monitor System, Lancets, glucose blood, metFORMIN, and medroxyPROGESTERone.  Meds ordered this encounter  Medications  . atorvastatin (LIPITOR) 40 MG tablet    Sig: Take 1 tablet (40 mg total) by mouth daily.    Dispense:  90 tablet    Refill:  1     Follow-up: No Follow-up on file.  Claretta Fraise, M.D.

## 2017-04-27 NOTE — Patient Instructions (Signed)
Please check your blood sugars twice daily, once in the morning fasting (before eating or drinking anything) and then check your blood sugar 2 hours after eating your largest meal of the day. Record both readings and bring them to your next office visit.

## 2017-05-03 LAB — CBC WITH DIFFERENTIAL/PLATELET
BASOS: 1 %
Basophils Absolute: 0 10*3/uL (ref 0.0–0.2)
EOS (ABSOLUTE): 0.2 10*3/uL (ref 0.0–0.4)
Eos: 4 %
HEMATOCRIT: 40.1 % (ref 34.0–46.6)
HEMOGLOBIN: 13 g/dL (ref 11.1–15.9)
IMMATURE GRANS (ABS): 0 10*3/uL (ref 0.0–0.1)
Immature Granulocytes: 0 %
LYMPHS ABS: 2.1 10*3/uL (ref 0.7–3.1)
LYMPHS: 39 %
MCH: 30.4 pg (ref 26.6–33.0)
MCHC: 32.4 g/dL (ref 31.5–35.7)
MCV: 94 fL (ref 79–97)
MONOCYTES: 5 %
Monocytes Absolute: 0.3 10*3/uL (ref 0.1–0.9)
Neutrophils Absolute: 2.8 10*3/uL (ref 1.4–7.0)
Neutrophils: 51 %
Platelets: 193 10*3/uL (ref 150–379)
RBC: 4.27 x10E6/uL (ref 3.77–5.28)
RDW: 14 % (ref 12.3–15.4)
WBC: 5.4 10*3/uL (ref 3.4–10.8)

## 2017-05-03 LAB — LIPID PANEL
CHOLESTEROL TOTAL: 236 mg/dL — AB (ref 100–199)
Chol/HDL Ratio: 4.7 ratio — ABNORMAL HIGH (ref 0.0–4.4)
HDL: 50 mg/dL (ref >39)
LDL Calculated: 166 mg/dL — ABNORMAL HIGH (ref 0–99)
TRIGLYCERIDES: 102 mg/dL (ref 0–149)
VLDL Cholesterol Cal: 20 mg/dL (ref 5–40)

## 2017-05-03 LAB — CMP14+EGFR
A/G RATIO: 1.4 (ref 1.2–2.2)
ALK PHOS: 73 IU/L (ref 39–117)
ALT: 57 IU/L — AB (ref 0–32)
AST: 47 IU/L — AB (ref 0–40)
Albumin: 4.1 g/dL (ref 3.5–5.5)
BUN/Creatinine Ratio: 20 (ref 9–23)
BUN: 14 mg/dL (ref 6–24)
Bilirubin Total: 0.6 mg/dL (ref 0.0–1.2)
CALCIUM: 9.3 mg/dL (ref 8.7–10.2)
CO2: 24 mmol/L (ref 20–29)
Chloride: 103 mmol/L (ref 96–106)
Creatinine, Ser: 0.71 mg/dL (ref 0.57–1.00)
GFR calc Af Amer: 115 mL/min/{1.73_m2} (ref >59)
GFR, EST NON AFRICAN AMERICAN: 100 mL/min/{1.73_m2} (ref >59)
Globulin, Total: 3 g/dL (ref 1.5–4.5)
Glucose: 114 mg/dL — ABNORMAL HIGH (ref 65–99)
POTASSIUM: 5.2 mmol/L (ref 3.5–5.2)
Sodium: 142 mmol/L (ref 134–144)
Total Protein: 7.1 g/dL (ref 6.0–8.5)

## 2017-05-23 ENCOUNTER — Other Ambulatory Visit: Payer: Self-pay | Admitting: Gastroenterology

## 2017-05-23 ENCOUNTER — Other Ambulatory Visit: Payer: Self-pay | Admitting: Family Medicine

## 2017-05-23 DIAGNOSIS — J029 Acute pharyngitis, unspecified: Secondary | ICD-10-CM | POA: Diagnosis not present

## 2017-05-25 ENCOUNTER — Ambulatory Visit: Payer: 59 | Admitting: Family Medicine

## 2017-05-25 ENCOUNTER — Encounter: Payer: Self-pay | Admitting: Family Medicine

## 2017-05-25 VITALS — BP 134/83 | HR 82 | Temp 98.4°F | Ht 63.0 in | Wt 242.0 lb

## 2017-05-25 DIAGNOSIS — J020 Streptococcal pharyngitis: Secondary | ICD-10-CM | POA: Diagnosis not present

## 2017-05-25 DIAGNOSIS — J029 Acute pharyngitis, unspecified: Secondary | ICD-10-CM | POA: Diagnosis not present

## 2017-05-25 DIAGNOSIS — H9202 Otalgia, left ear: Secondary | ICD-10-CM | POA: Diagnosis not present

## 2017-05-25 LAB — RAPID STREP SCREEN (MED CTR MEBANE ONLY): Strep Gp A Ag, IA W/Reflex: POSITIVE — AB

## 2017-05-25 MED ORDER — CIPROFLOXACIN-HYDROCORTISONE 0.2-1 % OT SUSP: 3.0000 [drp] | Freq: Two times a day (BID) | OTIC | 0 refills | Status: AC

## 2017-05-25 MED ORDER — AMOXICILLIN-POT CLAVULANATE 875-125 MG PO TABS: 1.0000 | ORAL_TABLET | Freq: Two times a day (BID) | ORAL | 0 refills | Status: DC

## 2017-05-25 MED ORDER — LIDOCAINE VISCOUS 2 % MT SOLN: OROMUCOSAL | 0 refills | Status: DC

## 2017-05-25 NOTE — Patient Instructions (Signed)
Otitis Media, Adult Otitis media is redness, soreness, and puffiness (swelling) in the space just behind your eardrum (middle ear). It may be caused by allergies or infection. It often happens along with a cold. Follow these instructions at home:  Take your medicine as told. Finish it even if you start to feel better.  Only take over-the-counter or prescription medicines for pain, discomfort, or fever as told by your doctor.  Follow up with your doctor as told. Contact a doctor if:  You have otitis media only in one ear, or bleeding from your nose, or both.  You notice a lump on your neck.  You are not getting better in 3-5 days.  You feel worse instead of better. Get help right away if:  You have pain that is not helped with medicine.  You have puffiness, redness, or pain around your ear.  You get a stiff neck.  You cannot move part of your face (paralysis).  You notice that the bone behind your ear hurts when you touch it. This information is not intended to replace advice given to you by your health care provider. Make sure you discuss any questions you have with your health care provider. Document Released: 08/18/2007 Document Revised: 08/07/2015 Document Reviewed: 09/26/2012 Elsevier Interactive Patient Education  2017 [employer] Strep Throat Strep throat is a bacterial infection of the throat. Your health care provider may call the infection tonsillitis or pharyngitis, depending on whether there is swelling in the tonsils or at the back of the throat. Strep throat is most common during the cold months of the year in children who are 38-40 years of age, but it can happen during any season in people of any age. This infection is spread from person to person (contagious) through coughing, sneezing, or close contact. What are the causes? Strep throat is caused by the bacteria called Streptococcus pyogenes. What increases the risk? This condition is more likely to develop  in:  People who spend time in crowded places where the infection can spread easily.  People who have close contact with someone who has strep throat.  What are the signs or symptoms? Symptoms of this condition include:  Fever or chills.  Redness, swelling, or pain in the tonsils or throat.  Pain or difficulty when swallowing.  White or yellow spots on the tonsils or throat.  Swollen, tender glands in the neck or under the jaw.  Red rash all over the body (rare).  How is this diagnosed? This condition is diagnosed by performing a rapid strep test or by taking a swab of your throat (throat culture test). Results from a rapid strep test are usually ready in a few minutes, but throat culture test results are available after one or two days. How is this treated? This condition is treated with antibiotic medicine. Follow these instructions at home: Medicines  Take over-the-counter and prescription medicines only as told by your health care provider.  Take your antibiotic as told by your health care provider. Do not stop taking the antibiotic even if you start to feel better.  Have family members who also have a sore throat or fever tested for strep throat. They may need antibiotics if they have the strep infection. Eating and drinking  Do not share food, drinking cups, or personal items that could cause the infection to spread to other people.  If swallowing is difficult, try eating soft foods until your sore throat feels better.  Drink enough fluid to keep your  urine clear or pale yellow. General instructions  Gargle with a salt-water mixture 3-4 times per day or as needed. To make a salt-water mixture, completely dissolve -1 tsp of salt in 1 cup of warm water.  Make sure that all household members wash their hands well.  Get plenty of rest.  Stay home from school or work until you have been taking antibiotics for 24 hours.  Keep all follow-up visits as told by your  health care provider. This is important. Contact a health care provider if:  The glands in your neck continue to get bigger.  You develop a rash, cough, or earache.  You cough up a thick liquid that is green, yellow-brown, or bloody.  You have pain or discomfort that does not get better with medicine.  Your problems seem to be getting worse rather than better.  You have a fever. Get help right away if:  You have new symptoms, such as vomiting, severe headache, stiff or painful neck, chest pain, or shortness of breath.  You have severe throat pain, drooling, or changes in your voice.  You have swelling of the neck, or the skin on the neck becomes red and tender.  You have signs of dehydration, such as fatigue, dry mouth, and decreased urination.  You become increasingly sleepy, or you cannot wake up completely.  Your joints become red or painful. This information is not intended to replace advice given to you by your health care provider. Make sure you discuss any questions you have with your health care provider. Document Released: 02/27/2000 Document Revised: 10/29/2015 Document Reviewed: 06/24/2014 Elsevier Interactive Patient Education  [employer].

## 2017-05-25 NOTE — Progress Notes (Signed)
Subjective: CC: URi PCP: Claretta Fraise, MD LDJ:Mary [name] is a 52 y.o. female presenting to clinic today for:  1. Ear pain Patient reports that she developed a cold last week that seemed to get better.  She notes on Sunday she developed sore throat.  On Monday symptoms seem to be worsening and [facility]  She had a negative strep there.  She was told that she probably had postnasal drip and was instructed to use Zyrtec and Flonase.  She has had little improvement in symptoms and notes that yesterday she started having significant left ear pain with associated brown discharge/drainage.  Denies fevers, chills, nausea, vomiting, shortness of breath.  She is hydrating without difficulty.   ROS: Per HPI  No Known Allergies Past Medical History:  Diagnosis Date  . Allergy    seasonal  . Arthritis   . GERD (gastroesophageal reflux disease)   . Mononucleosis   . Seasonal allergies   . Viral hepatitis 10/2016    Current Outpatient Medications:  .  atorvastatin (LIPITOR) 40 MG tablet, Take 1 tablet (40 mg total) by mouth daily., Disp: 90 tablet, Rfl: 1 .  Blood Glucose Monitoring Suppl (BLOOD GLUCOSE MONITOR SYSTEM) w/Device KIT, 1 Device 2 (two) times daily by Does not apply route., Disp: 1 each, Rfl: 0 .  cetirizine (ZYRTEC) 10 MG tablet, Take 10 mg by mouth daily., Disp: , Rfl:  .  escitalopram (LEXAPRO) 10 MG tablet, Take 10 mg by mouth daily., Disp: , Rfl:  .  famotidine (PEPCID) 10 MG tablet, Take 10 mg by mouth daily., Disp: , Rfl:  .  glucose blood test strip, Use as instructed, Disp: 100 each, Rfl: 12 .  ibuprofen (ADVIL,MOTRIN) 200 MG tablet, Take 200 mg by mouth as needed for pain., Disp: , Rfl:  .  Lancets MISC, Use to test blood sugar twice daily., Disp: 100 each, Rfl: 3 .  medroxyPROGESTERone (PROVERA) 10 MG tablet, , Disp: , Rfl:  .  metFORMIN (GLUCOPHAGE) 500 MG tablet, TAKE ONE TABLET TWICE A DAY WITH FOOD, Disp: 60 tablet, Rfl:  1 .  metFORMIN (GLUCOPHAGE) 500 MG tablet, TAKE ONE TABLET TWICE A DAY WITH FOOD, Disp: 60 tablet, Rfl: 2 .  Multiple Vitamin (MULTIVITAMIN) tablet, Take 1 tablet daily by mouth., Disp: , Rfl:  Social History   Socioeconomic History  . Marital status: Married    Spouse name: Not on file  . Number of children: Not on file  . Years of education: Not on file  . Highest education level: Not on file  Social Needs  . Financial resource strain: Not on file  . Food insecurity - worry: Not on file  . Food insecurity - inability: Not on file  . Transportation needs - medical: Not on file  . Transportation needs - non-medical: Not on file  Occupational History  . Not on file  Tobacco Use  . Smoking status: Never Smoker  . Smokeless tobacco: Never Used  Substance and Sexual Activity  . Alcohol use: Yes    Comment: occasional  . Drug use: No  . Sexual activity: Not on file  Other Topics Concern  . Not on file  Social History Narrative  . Not on file   Family History  Problem Relation Age of Onset  . Cancer Mother 69       colon  . Diabetes Brother   . Heart disease Paternal Grandmother     Objective: Office vital  signs reviewed. BP 134/83   Pulse 82   Temp 98.4 F (36.9 C) (Oral)   Ht 5' 3"  (1.6 m)   Wt 242 lb (109.8 kg)   BMI 42.87 kg/m   Physical Examination:  General: Awake, alert, well nourished, No acute distress HEENT: Normal    Neck: No masses palpated. No lymphadenopathy    Ears: Left TM obscured by profuse creamy discharge within the external auditory canal.  Erythema of the external auditory canal noted.  She has tenderness to palpation with examination.  No tragal tenderness to palpation and no mastoid tenderness to palpation.    Eyes: PERRLA, extraocular membranes intact, sclera white    Nose: nasal turbinates moist, clear nasal discharge    Throat: moist mucus membranes, moderate oropharyngeal erythema, + tonsillar exudate.  Airway is patent Cardio: regular  rate and rhythm, S1S2 heard, no murmurs appreciated Pulm: clear to auscultation bilaterally, no wheezes, rhonchi or rales; normal work of breathing on room air  Assessment/ Plan: 52 y.o. female   1. Strep pharyngitis Patient is afebrile and nontoxic-appearing.  She does have a positive strep test here in office.  She also appears to have an acute otitis media that may have a perforation.  Difficult to tell on today's exam.  Augmentin 875 p.o. twice daily for the next 10 days prescribed to cover for strep and ear infection.  I have also prescribed her Cipro HC otic suspension to use twice daily for the next 7 days.  She will follow-up if she develops any worrisome symptoms or signs.  Home care instructions were reviewed and handout was provided.  Work note provided. - Rapid Strep Screen (Not at [facility]) - Culture, Group A Strep  2. Acute pain of left ear See above.   Orders Placed This Encounter  Procedures  . Rapid Strep Screen (Not at [facility])  . Culture, Group A Strep    Order Specific Question:   Source    Answer:   throat   Meds ordered this encounter  Medications  . amoxicillin-clavulanate (AUGMENTIN) 875-125 MG tablet    Sig: Take 1 tablet by mouth 2 (two) times daily.    Dispense:  20 tablet    Refill:  0  . ciprofloxacin-hydrocortisone (CIPRO HC) OTIC suspension    Sig: Place 3 drops into the left ear 2 (two) times daily for 7 days.    Dispense:  10 mL    Refill:  0  . lidocaine (XYLOCAINE) 2 % solution    Sig: Gargle and spit 15 mL every 4 hours as needed for sore throat.    Dispense:  200 mL    Refill:  [place], DO [place] [phone]

## 2017-05-26 ENCOUNTER — Telehealth: Payer: Self-pay

## 2017-05-26 DIAGNOSIS — R7989 Other specified abnormal findings of blood chemistry: Secondary | ICD-10-CM

## 2017-05-26 DIAGNOSIS — R945 Abnormal results of liver function studies: Principal | ICD-10-CM

## 2017-05-26 NOTE — Telephone Encounter (Signed)
Called and spoke to pt.  She said she is sick with strep this week but can go to the lab one day next week.

## 2017-05-26 NOTE — Telephone Encounter (Signed)
-----   Message from Larina Bras, [place] sent at 05/23/2017  5:20 PM EDT -----   ----- Message ----- From: Larina Bras, CMA Sent: 05/23/2017 To: Roetta Sessions, CMA  Needs repeat lfts around 06/01/17. See 03/02/17 labs. Orders already in epic. Just needs reminder call

## 2017-05-27 ENCOUNTER — Telehealth: Payer: Self-pay | Admitting: Family Medicine

## 2017-05-27 NOTE — Telephone Encounter (Signed)
Pt notifed work note has been extended and is ready for pick up

## 2017-05-30 DIAGNOSIS — H6062 Unspecified chronic otitis externa, left ear: Secondary | ICD-10-CM | POA: Diagnosis not present

## 2017-05-30 DIAGNOSIS — H6692 Otitis media, unspecified, left ear: Secondary | ICD-10-CM | POA: Diagnosis not present

## 2017-05-30 DIAGNOSIS — H6522 Chronic serous otitis media, left ear: Secondary | ICD-10-CM | POA: Diagnosis not present

## 2017-06-02 ENCOUNTER — Other Ambulatory Visit (INDEPENDENT_AMBULATORY_CARE_PROVIDER_SITE_OTHER): Payer: 59

## 2017-06-02 DIAGNOSIS — R7989 Other specified abnormal findings of blood chemistry: Secondary | ICD-10-CM

## 2017-06-02 DIAGNOSIS — R945 Abnormal results of liver function studies: Secondary | ICD-10-CM | POA: Diagnosis not present

## 2017-06-03 DIAGNOSIS — H6692 Otitis media, unspecified, left ear: Secondary | ICD-10-CM | POA: Diagnosis not present

## 2017-06-03 DIAGNOSIS — H6522 Chronic serous otitis media, left ear: Secondary | ICD-10-CM | POA: Diagnosis not present

## 2017-06-03 DIAGNOSIS — H6062 Unspecified chronic otitis externa, left ear: Secondary | ICD-10-CM | POA: Diagnosis not present

## 2017-06-03 LAB — HEPATIC FUNCTION PANEL
ALT: 45 U/L — AB (ref 0–35)
AST: 45 U/L — AB (ref 0–37)
Albumin: 3.9 g/dL (ref 3.5–5.2)
Alkaline Phosphatase: 66 U/L (ref 39–117)
BILIRUBIN TOTAL: 0.3 mg/dL (ref 0.2–1.2)
Bilirubin, Direct: 0.1 mg/dL (ref 0.0–0.3)
Total Protein: 7.2 g/dL (ref 6.0–8.3)

## 2017-06-06 ENCOUNTER — Other Ambulatory Visit: Payer: Self-pay

## 2017-06-06 DIAGNOSIS — R935 Abnormal findings on diagnostic imaging of other abdominal regions, including retroperitoneum: Secondary | ICD-10-CM

## 2017-06-08 DIAGNOSIS — H6522 Chronic serous otitis media, left ear: Secondary | ICD-10-CM | POA: Diagnosis not present

## 2017-06-08 DIAGNOSIS — H9042 Sensorineural hearing loss, unilateral, left ear, with unrestricted hearing on the contralateral side: Secondary | ICD-10-CM | POA: Diagnosis not present

## 2017-06-16 ENCOUNTER — Ambulatory Visit
Admission: RE | Admit: 2017-06-16 | Discharge: 2017-06-16 | Disposition: A | Discharge status: Home / Self Care | Payer: 59 | Source: Ambulatory Visit | Attending: Gastroenterology | Admitting: Gastroenterology

## 2017-06-16 DIAGNOSIS — R599 Enlarged lymph nodes, unspecified: Secondary | ICD-10-CM | POA: Diagnosis not present

## 2017-06-16 DIAGNOSIS — R935 Abnormal findings on diagnostic imaging of other abdominal regions, including retroperitoneum: Secondary | ICD-10-CM

## 2017-06-16 MED ORDER — IOPAMIDOL (ISOVUE-300) INJECTION 61%
100.0000 mL | Freq: Once | INTRAVENOUS | Status: AC | PRN
  Administered 2017-06-16: 100 mL via INTRAVENOUS

## 2017-06-20 DIAGNOSIS — H903 Sensorineural hearing loss, bilateral: Secondary | ICD-10-CM | POA: Diagnosis not present

## 2017-07-23 ENCOUNTER — Other Ambulatory Visit: Payer: Self-pay | Admitting: Family Medicine

## 2017-07-27 ENCOUNTER — Ambulatory Visit: Payer: 59 | Admitting: Family Medicine

## 2017-08-04 ENCOUNTER — Telehealth: Payer: Self-pay | Admitting: Family Medicine

## 2017-08-04 NOTE — Telephone Encounter (Signed)
Please advise 

## 2017-08-05 ENCOUNTER — Other Ambulatory Visit: Payer: Self-pay | Admitting: *Deleted

## 2017-08-05 DIAGNOSIS — E119 Type 2 diabetes mellitus without complications: Secondary | ICD-10-CM

## 2017-08-05 NOTE — Telephone Encounter (Signed)
Lab orders placed.  

## 2017-08-05 NOTE — Telephone Encounter (Signed)
Second call patient wants to come in this afternoon for labs. Stacks and Florentina Jenny are both off. Patient wants orders put in for regular 3 mth rck labs. Wants a call once orders are in. Please advise.

## 2017-08-09 ENCOUNTER — Ambulatory Visit: Payer: 59 | Admitting: Family Medicine

## 2017-08-25 ENCOUNTER — Ambulatory Visit: Payer: 59 | Admitting: Family Medicine

## 2017-08-27 ENCOUNTER — Other Ambulatory Visit: Payer: 59

## 2017-08-27 DIAGNOSIS — E119 Type 2 diabetes mellitus without complications: Secondary | ICD-10-CM | POA: Diagnosis not present

## 2017-08-27 DIAGNOSIS — Z7689 Persons encountering health services in other specified circumstances: Secondary | ICD-10-CM | POA: Diagnosis not present

## 2017-08-28 LAB — CMP14+EGFR
ALBUMIN: 4.1 g/dL (ref 3.5–5.5)
ALK PHOS: 77 IU/L (ref 39–117)
ALT: 24 IU/L (ref 0–32)
AST: 24 IU/L (ref 0–40)
Albumin/Globulin Ratio: 1.5 (ref 1.2–2.2)
BILIRUBIN TOTAL: 0.4 mg/dL (ref 0.0–1.2)
BUN / CREAT RATIO: 25 — AB (ref 9–23)
BUN: 20 mg/dL (ref 6–24)
CHLORIDE: 103 mmol/L (ref 96–106)
CO2: 24 mmol/L (ref 20–29)
Calcium: 9.7 mg/dL (ref 8.7–10.2)
Creatinine, Ser: 0.81 mg/dL (ref 0.57–1.00)
GFR calc Af Amer: 97 mL/min/{1.73_m2} (ref >59)
GFR calc non Af Amer: 84 mL/min/{1.73_m2} (ref >59)
GLOBULIN, TOTAL: 2.8 g/dL (ref 1.5–4.5)
GLUCOSE: 106 mg/dL — AB (ref 65–99)
Potassium: 5.1 mmol/L (ref 3.5–5.2)
SODIUM: 146 mmol/L — AB (ref 134–144)
Total Protein: 6.9 g/dL (ref 6.0–8.5)

## 2017-08-29 LAB — BAYER DCA HB A1C WAIVED: HB A1C: 5.7 % (ref <7.0)

## 2017-08-30 ENCOUNTER — Ambulatory Visit: Payer: 59 | Admitting: Family Medicine

## 2017-08-30 ENCOUNTER — Encounter: Payer: Self-pay | Admitting: Family Medicine

## 2017-08-30 VITALS — BP 121/72 | HR 66 | Temp 97.7°F | Ht 63.0 in | Wt 240.0 lb

## 2017-08-30 DIAGNOSIS — Z23 Encounter for immunization: Secondary | ICD-10-CM | POA: Diagnosis not present

## 2017-08-30 DIAGNOSIS — E119 Type 2 diabetes mellitus without complications: Secondary | ICD-10-CM

## 2017-08-30 DIAGNOSIS — S0990XS Unspecified injury of head, sequela: Secondary | ICD-10-CM

## 2017-08-30 DIAGNOSIS — H9192 Unspecified hearing loss, left ear: Secondary | ICD-10-CM | POA: Diagnosis not present

## 2017-08-30 LAB — LIPID PANEL WITH LDL/HDL RATIO
Cholesterol, Total: 126 mg/dL (ref 100–199)
HDL: 51 mg/dL (ref >39)
LDL CALC: 61 mg/dL (ref 0–99)
LDl/HDL Ratio: 1.2 ratio (ref 0.0–3.2)
Triglycerides: 72 mg/dL (ref 0–149)
VLDL CHOLESTEROL CAL: 14 mg/dL (ref 5–40)

## 2017-08-30 LAB — SPECIMEN STATUS REPORT

## 2017-08-30 MED ORDER — METFORMIN HCL 500 MG PO TABS: ORAL_TABLET | ORAL | 1 refills | Status: DC

## 2017-08-30 MED ORDER — ATORVASTATIN CALCIUM 40 MG PO TABS: ORAL_TABLET | ORAL | 1 refills | Status: DC

## 2017-08-30 NOTE — Patient Instructions (Signed)

## 2017-08-30 NOTE — Progress Notes (Signed)
Subjective:  Patient ID: Mary [name], female    DOB: 1965-06-19  Age: 52 y.o. MRN: [id number]  CC: Medical Management of Chronic Issues   HPI ZEFFIE BICKERT presents forFollow-up of diabetes. Patient checks blood sugar at home.  Log brought with her and reviewed.  Shows most of her fasting sugars to be between 101 25.  Postprandial readings are in the same range. Patient denies symptoms such as polyuria, polydipsia, excessive hunger, nausea No significant hypoglycemic spells noted. Medications reviewed. Pt reports taking them regularly without complication/adverse reaction being reported today.  Checking feet daily. Last eye appt was remote.  Patient encouraged to schedule.  History Keatyn has a past medical history of Allergy, Arthritis, GERD (gastroesophageal reflux disease), Mononucleosis, Seasonal allergies, and Viral hepatitis (10/2016).   She has a past surgical history that includes Tubal ligation (2004) and Carpal tunnel release (Left, 12/05/2012).   Her family history includes Cancer (age of onset: 41) in her mother; Diabetes in her brother; Heart disease in her paternal grandmother.She reports that she has never smoked. She has never used smokeless tobacco. She reports that she drinks alcohol. She reports that she does not use drugs.  Current Outpatient Medications on File Prior to Visit  Medication Sig Dispense Refill  . Blood Glucose Monitoring Suppl (BLOOD GLUCOSE MONITOR SYSTEM) w/Device KIT 1 Device 2 (two) times daily by Does not apply route. 1 each 0  . cetirizine (ZYRTEC) 10 MG tablet Take 10 mg by mouth daily.    Marland Kitchen escitalopram (LEXAPRO) 10 MG tablet Take 10 mg by mouth daily.    . famotidine (PEPCID) 10 MG tablet Take 10 mg by mouth daily.    Marland Kitchen glucose blood test strip Use as instructed 100 each 12  . ibuprofen (ADVIL,MOTRIN) 200 MG tablet Take 200 mg by mouth as needed for pain.    . Lancets MISC Use to test blood sugar twice daily. 100 each 3  . lidocaine  (XYLOCAINE) 2 % solution Gargle and spit 15 mL every 4 hours as needed for sore throat. 200 mL 0  . medroxyPROGESTERone (PROVERA) 10 MG tablet     . Multiple Vitamin (MULTIVITAMIN) tablet Take 1 tablet daily by mouth.     No current facility-administered medications on file prior to visit.     ROS Review of Systems  Constitutional: Negative.   HENT: Positive for hearing loss (Left ear only.  Planning to get a hearing aid soon.). Negative for congestion.   Eyes: Negative for visual disturbance.  Respiratory: Negative for shortness of breath.   Cardiovascular: Negative for chest pain.  Gastrointestinal: Negative for abdominal pain, constipation, diarrhea, nausea and vomiting.  Genitourinary: Negative for difficulty urinating.  Musculoskeletal: Negative for arthralgias and myalgias.  Neurological: Negative for headaches.  Psychiatric/Behavioral: Negative for sleep disturbance.    Objective:  BP 121/72   Pulse 66   Temp 97.7 F (36.5 C) (Oral)   Ht _0  (1.6 m)   Wt 240 lb (108.9 kg)   BMI 42.51 kg/m   BP Readings from Last 3 Encounters:  08/30/17 121/72  05/25/17 134/83  04/27/17 118/76    Wt Readings from Last 3 Encounters:  08/30/17 240 lb (108.9 kg)  05/25/17 242 lb (109.8 kg)  04/27/17 244 lb 8 oz (110.9 kg)     Physical Exam  Constitutional: She is oriented to person, place, and time. She appears well-developed and well-nourished. No distress.  HENT:  Head: Normocephalic and atraumatic.  Right Ear: External ear normal.  Left Ear: External ear normal.  Nose: Nose normal.  Mouth/Throat: Oropharynx is clear and moist.  Eyes: Pupils are equal, round, and reactive to light. Conjunctivae and EOM are normal.  Neck: Normal range of motion. Neck supple. No thyromegaly present.  Cardiovascular: Normal rate, regular rhythm and normal heart sounds.  No murmur heard. Pulmonary/Chest: Effort normal and breath sounds normal. No respiratory distress. She has no wheezes. She  has no rales.  Abdominal: Soft. Bowel sounds are normal. She exhibits no distension. There is no tenderness.  Lymphadenopathy:    She has no cervical adenopathy.  Neurological: She is alert and oriented to person, place, and time. She has normal reflexes.  Skin: Skin is warm and dry.  Psychiatric: She has a normal mood and affect. Her behavior is normal. Judgment and thought content normal.      Assessment & Plan:   Lajoy was seen today for medical management of chronic issues.  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 Pettinger's amoxicillin-clavulanate. I am also having her maintain her cetirizine, ibuprofen, escitalopram, famotidine, multivitamin, Blood Glucose Monitor System, Lancets, glucose blood, medroxyPROGESTERone, lidocaine, atorvastatin, and metFORMIN.  Meds ordered this encounter  Medications  . atorvastatin (LIPITOR) 40 MG tablet    Sig: TAKE ONE (1) TABLET EACH DAY    Dispense:  90 tablet    Refill:  1  . metFORMIN (GLUCOPHAGE) 500 MG tablet    Sig: TAKE ONE TABLET TWICE A DAY WITH FOOD    Dispense:  180 tablet    Refill:  1     Follow-up: Return in about 3 months (around 11/30/2017).  Claretta Fraise, M.D.

## 2017-09-23 DIAGNOSIS — H903 Sensorineural hearing loss, bilateral: Secondary | ICD-10-CM | POA: Diagnosis not present

## 2017-11-16 ENCOUNTER — Encounter: Payer: Self-pay | Admitting: Pediatrics

## 2017-11-16 ENCOUNTER — Ambulatory Visit: Payer: 59 | Admitting: Pediatrics

## 2017-11-16 VITALS — BP 113/74 | HR 63 | Temp 98.3°F | Ht 63.0 in | Wt 248.0 lb

## 2017-11-16 DIAGNOSIS — J0110 Acute frontal sinusitis, unspecified: Secondary | ICD-10-CM | POA: Diagnosis not present

## 2017-11-16 DIAGNOSIS — H65112 Acute and subacute allergic otitis media (mucoid) (sanguinous) (serous), left ear: Secondary | ICD-10-CM | POA: Diagnosis not present

## 2017-11-16 DIAGNOSIS — L089 Local infection of the skin and subcutaneous tissue, unspecified: Secondary | ICD-10-CM | POA: Diagnosis not present

## 2017-11-16 DIAGNOSIS — L720 Epidermal cyst: Secondary | ICD-10-CM

## 2017-11-16 MED ORDER — DOXYCYCLINE HYCLATE 100 MG PO TABS: 100.0000 mg | ORAL_TABLET | Freq: Two times a day (BID) | ORAL | 0 refills | Status: DC

## 2017-11-16 NOTE — Progress Notes (Signed)
  Subjective:   Patient ID: Mary [name], female    DOB: 1965/04/14, 52 y.o.   MRN: [id number] CC: Right ear pain, sinus congestion  HPI: Mary [name] is a 52 y.o. female   Symptoms ongoing past week, getting worse. Some subjective fevers at home. +facial pain. L ear bothering her. Appetite has been down. Taking antihistamine daily.  Place on back that has been sore, red. No discharge.  Relevant past medical, surgical, family and social history reviewed. Allergies and medications reviewed and updated. Social History   Tobacco Use  Smoking Status Never Smoker  Smokeless Tobacco Never Used   ROS: Per HPI   Objective:    BP 113/74   Pulse 63   Temp 98.3 F (36.8 C) (Oral)   Ht 5\' 3"  (1.6 m)   Wt 248 lb (112.5 kg)   BMI 43.93 kg/m   Wt Readings from Last 3 Encounters:  11/16/17 248 lb (112.5 kg)  08/30/17 240 lb (108.9 kg)  05/25/17 242 lb (109.8 kg)    Gen: NAD, alert, cooperative with exam, NCAT EYES: EOMI, no conjunctival injection, or no icterus ENT:  L Tm with layering yellow effusion, R Tm nl, OP without erythema, ttp over sinuses LYMPH: no cervical LAD CV: NRRR, normal S1/S2, no murmur, distal pulses 2+ b/l Resp: CTABL, no wheezes, normal WOB Abd: +BS, soft, NTND. no guarding or organomegaly Ext: No edema, warm Neuro: Alert and oriented Skin: apprx 3cm x 1cm flat hard nodule under the skin, slightly red, ttp  Assessment & Plan:  Mary [name] was seen today for right ear pain, sinus congestion.  Diagnoses and all orders for this visit:  Acute non-recurrent frontal sinusitis Symptom care, return precautions discussed. Start below -     doxycycline (VIBRA-TABS) 100 MG tablet; Take 1 tablet (100 mg total) by mouth 2 (two) times daily.  Acute mucoid otitis media of left ear -     doxycycline (VIBRA-TABS) 100 MG tablet; Take 1 tablet (100 mg total) by mouth 2 (two) times daily.  Infected epidermoid cyst Treat with below, discussed return precautions, should have  it checked by pcp next office visit, any worsening let us know. May need to see derm to have removed. -     doxycycline (VIBRA-TABS) 100 MG tablet; Take 1 tablet (100 mg total) by mouth 2 (two) times daily.   Follow up plan: No follow-ups on file. Assunta Found, MD [place]

## 2017-11-30 ENCOUNTER — Ambulatory Visit: Payer: 59 | Admitting: Family Medicine

## 2017-12-15 ENCOUNTER — Telehealth: Payer: Self-pay

## 2017-12-15 DIAGNOSIS — R7989 Other specified abnormal findings of blood chemistry: Secondary | ICD-10-CM

## 2017-12-15 DIAGNOSIS — R945 Abnormal results of liver function studies: Principal | ICD-10-CM

## 2017-12-15 NOTE — Addendum Note (Signed)
Addended by: Roetta Sessions on: 12/15/2017 11:03 AM   Modules accepted: Orders

## 2017-12-15 NOTE — Telephone Encounter (Signed)
Called and spoke to pt.  She will go to the lab one day next week.  Order entered.

## 2017-12-15 NOTE — Telephone Encounter (Signed)
-----   Message from Roetta Sessions, [place] sent at 06/16/2017  3:42 PM EDT ----- Regarding: labs due LFTs due. (elevated LFTs)

## 2017-12-19 ENCOUNTER — Ambulatory Visit: Payer: 59 | Admitting: Family Medicine

## 2017-12-19 ENCOUNTER — Encounter: Payer: Self-pay | Admitting: Family Medicine

## 2017-12-19 VITALS — BP 112/77 | HR 75 | Temp 98.0°F | Ht 63.0 in | Wt 249.6 lb

## 2017-12-19 DIAGNOSIS — E785 Hyperlipidemia, unspecified: Secondary | ICD-10-CM | POA: Diagnosis not present

## 2017-12-19 DIAGNOSIS — K219 Gastro-esophageal reflux disease without esophagitis: Secondary | ICD-10-CM | POA: Diagnosis not present

## 2017-12-19 DIAGNOSIS — E119 Type 2 diabetes mellitus without complications: Secondary | ICD-10-CM

## 2017-12-19 MED ORDER — ATORVASTATIN CALCIUM 40 MG PO TABS
ORAL_TABLET | ORAL | 1 refills | Status: DC
Start: 2017-12-19 — End: 2018-06-05

## 2017-12-19 MED ORDER — METFORMIN HCL 500 MG PO TABS: ORAL_TABLET | ORAL | 1 refills | Status: DC

## 2017-12-19 MED ORDER — GLUCOSE BLOOD VI STRP: ORAL_STRIP | 12 refills | Status: DC

## 2017-12-19 NOTE — Progress Notes (Signed)
Subjective:  Patient ID: Mary [name], female    DOB: 02-Nov-1965  Age: 52 y.o. MRN: [id number]  CC: Medical Management of Chronic Issues   HPI Mary [name] presents forFollow-up of diabetes. Patient checks blood sugar at home.  102 125 fasting and 102 132 postprandial Patient denies symptoms such as polyuria, polydipsia, excessive hunger, nausea No significant hypoglycemic spells noted. Medications reviewed. Pt reports taking them regularly without complication/adverse reaction being reported today.  Checking feet daily. History Mary [name] has a past medical history of Allergy, Arthritis, GERD (gastroesophageal reflux disease), Mononucleosis, Seasonal allergies, and Viral hepatitis (10/2016).   She has a past surgical history that includes Tubal ligation (2004) and Carpal tunnel release (Left, 12/05/2012).   Her family history includes Cancer (age of onset: 24) in her mother; Diabetes in her brother; Heart disease in her paternal grandmother.She reports that she has never smoked. She has never used smokeless tobacco. She reports that she drinks alcohol. She reports that she does not use drugs.  Current Outpatient Medications on File Prior to Visit  Medication Sig Dispense Refill  . Blood Glucose Monitoring Suppl (BLOOD GLUCOSE MONITOR SYSTEM) w/Device KIT 1 Device 2 (two) times daily by Does not apply route. 1 each 0  . cetirizine (ZYRTEC) 10 MG tablet Take 10 mg by mouth daily.    Marland Kitchen escitalopram (LEXAPRO) 10 MG tablet Take 20 mg by mouth daily.     . famotidine (PEPCID) 10 MG tablet Take 10 mg by mouth daily.    Marland Kitchen ibuprofen (ADVIL,MOTRIN) 200 MG tablet Take 200 mg by mouth as needed for pain.    . Lancets MISC Use to test blood sugar twice daily. 100 each 3  . lidocaine (XYLOCAINE) 2 % solution Gargle and spit 15 mL every 4 hours as needed for sore throat. 200 mL 0  . medroxyPROGESTERone (PROVERA) 10 MG tablet     . Multiple Vitamin (MULTIVITAMIN) tablet Take 1 tablet daily by mouth.      No current facility-administered medications on file prior to visit.     ROS Review of Systems  Constitutional: Negative.   HENT: Negative for congestion.   Eyes: Negative for visual disturbance.  Respiratory: Negative for shortness of breath.   Cardiovascular: Negative for chest pain.  Gastrointestinal: Negative for abdominal pain, constipation, diarrhea, nausea and vomiting.  Genitourinary: Negative for difficulty urinating.  Musculoskeletal: Negative for arthralgias and myalgias.  Neurological: Negative for headaches.  Psychiatric/Behavioral: Negative for sleep disturbance.    Objective:  BP 112/77   Pulse 75   Temp 98 F (36.7 C) (Oral)   Ht 5' 3"  (1.6 m)   Wt 249 lb 9.6 oz (113.2 kg)   BMI 44.21 kg/m   BP Readings from Last 3 Encounters:  12/19/17 112/77  11/16/17 113/74  08/30/17 121/72    Wt Readings from Last 3 Encounters:  12/19/17 249 lb 9.6 oz (113.2 kg)  11/16/17 248 lb (112.5 kg)  08/30/17 240 lb (108.9 kg)     Physical Exam  Constitutional: She is oriented to person, place, and time. She appears well-developed and well-nourished. No distress.  HENT:  Head: Normocephalic and atraumatic.  Right Ear: External ear normal.  Left Ear: External ear normal.  Nose: Nose normal.  Mouth/Throat: Oropharynx is clear and moist.  Eyes: Pupils are equal, round, and reactive to light. Conjunctivae and EOM are normal.  Neck: Normal range of motion. Neck supple. No thyromegaly present.  Cardiovascular: Normal rate, regular rhythm and normal heart sounds.  No  murmur heard. Pulmonary/Chest: Effort normal and breath sounds normal. No respiratory distress. She has no wheezes. She has no rales.  Abdominal: Soft. Bowel sounds are normal. She exhibits no distension. There is no tenderness.  Lymphadenopathy:    She has no cervical adenopathy.  Neurological: She is alert and oriented to person, place, and time. She has normal reflexes.  Skin: Skin is warm and dry.    Psychiatric: She has a normal mood and affect. Her behavior is normal. Judgment and thought content normal.      Assessment & Plan:   Mary [name] was seen today for medical management of chronic issues.  Diagnoses and all orders for this visit:  Type 2 diabetes mellitus without complication, without long-term current use of insulin (HCC) -     CMP14+EGFR -     CBC with Differential/Platelet -     Cancel: Bayer DCA Hb A1c Waived -     Microalbumin / creatinine urine ratio -     Bayer DCA Hb A1c Waived  Hyperlipidemia, unspecified hyperlipidemia type -     CMP14+EGFR -     CBC with Differential/Platelet -     Cancel: Bayer DCA Hb A1c Waived -     Microalbumin / creatinine urine ratio  Gastroesophageal reflux disease without esophagitis -     CMP14+EGFR -     CBC with Differential/Platelet -     Cancel: Bayer DCA Hb A1c Waived -     Microalbumin / creatinine urine ratio  Other orders -     metFORMIN (GLUCOPHAGE) 500 MG tablet; TAKE ONE TABLET TWICE A DAY WITH FOOD -     glucose blood test strip; Use as instructed -     atorvastatin (LIPITOR) 40 MG tablet; TAKE ONE (1) TABLET EACH DAY      I have discontinued Mary [name]'s doxycycline. I am also having her maintain her cetirizine, ibuprofen, escitalopram, famotidine, multivitamin, Blood Glucose Monitor System, Lancets, medroxyPROGESTERone, lidocaine, metFORMIN, glucose blood, and atorvastatin.  Meds ordered this encounter  Medications  . metFORMIN (GLUCOPHAGE) 500 MG tablet    Sig: TAKE ONE TABLET TWICE A DAY WITH FOOD    Dispense:  180 tablet    Refill:  1  . glucose blood test strip    Sig: Use as instructed    Dispense:  100 each    Refill:  12  . atorvastatin (LIPITOR) 40 MG tablet    Sig: TAKE ONE (1) TABLET EACH DAY    Dispense:  90 tablet    Refill:  1     Follow-up: Return in about 3 months (around 03/21/2018).  Claretta Fraise, M.D.

## 2017-12-20 ENCOUNTER — Telehealth: Payer: Self-pay | Admitting: Gastroenterology

## 2017-12-20 LAB — CMP14+EGFR
ALBUMIN: 4.4 g/dL (ref 3.5–5.5)
ALT: 25 IU/L (ref 0–32)
AST: 28 IU/L (ref 0–40)
Albumin/Globulin Ratio: 1.6 (ref 1.2–2.2)
Alkaline Phosphatase: 84 IU/L (ref 39–117)
BUN/Creatinine Ratio: 18 (ref 9–23)
BUN: 12 mg/dL (ref 6–24)
Bilirubin Total: 0.9 mg/dL (ref 0.0–1.2)
CALCIUM: 9.6 mg/dL (ref 8.7–10.2)
CO2: 27 mmol/L (ref 20–29)
CREATININE: 0.66 mg/dL (ref 0.57–1.00)
Chloride: 104 mmol/L (ref 96–106)
GFR calc Af Amer: 118 mL/min/{1.73_m2} (ref >59)
GFR calc non Af Amer: 103 mL/min/{1.73_m2} (ref >59)
GLOBULIN, TOTAL: 2.8 g/dL (ref 1.5–4.5)
Glucose: 95 mg/dL (ref 65–99)
Potassium: 4.5 mmol/L (ref 3.5–5.2)
Sodium: 145 mmol/L — ABNORMAL HIGH (ref 134–144)
TOTAL PROTEIN: 7.2 g/dL (ref 6.0–8.5)

## 2017-12-20 LAB — CBC WITH DIFFERENTIAL/PLATELET
Basophils Absolute: 0.1 10*3/uL (ref 0.0–0.2)
Basos: 1 %
EOS (ABSOLUTE): 0.1 10*3/uL (ref 0.0–0.4)
EOS: 2 %
HEMATOCRIT: 40.5 % (ref 34.0–46.6)
HEMOGLOBIN: 13.4 g/dL (ref 11.1–15.9)
IMMATURE GRANULOCYTES: 0 %
Immature Grans (Abs): 0 10*3/uL (ref 0.0–0.1)
Lymphocytes Absolute: 2.9 10*3/uL (ref 0.7–3.1)
Lymphs: 38 %
MCH: 29.6 pg (ref 26.6–33.0)
MCHC: 33.1 g/dL (ref 31.5–35.7)
MCV: 89 fL (ref 79–97)
MONOCYTES: 5 %
Monocytes Absolute: 0.4 10*3/uL (ref 0.1–0.9)
NEUTROS PCT: 54 %
Neutrophils Absolute: 4.1 10*3/uL (ref 1.4–7.0)
Platelets: 259 10*3/uL (ref 150–450)
RBC: 4.53 x10E6/uL (ref 3.77–5.28)
RDW: 13 % (ref 12.3–15.4)
WBC: 7.7 10*3/uL (ref 3.4–10.8)

## 2017-12-20 LAB — MICROALBUMIN / CREATININE URINE RATIO
Creatinine, Urine: 184.2 mg/dL
MICROALB/CREAT RATIO: 6.8 mg/g{creat} (ref 0.0–30.0)
MICROALBUM., U, RANDOM: 12.5 ug/mL

## 2017-12-20 LAB — BAYER DCA HB A1C WAIVED: HB A1C (BAYER DCA - WAIVED): 6 % (ref <7.0)

## 2017-12-20 NOTE — Telephone Encounter (Signed)
Spoke to patient to let her know that no further labs needed at this time, repeat LFT's in one year.

## 2017-12-20 NOTE — Telephone Encounter (Signed)
Please see labs done at her PCP office yesterday, looks like you had only a LFT ordered. Only thing that it does not show is the bilirubin direct. Patient wanting to know if she needs further lab work, thanks.

## 2017-12-20 NOTE — Telephone Encounter (Signed)
Pt just had labs with PCP and wants to know if those were the same that Dr. Havery Moros ordered for her. Pls call her.

## 2017-12-20 NOTE — Telephone Encounter (Signed)
Reviewed LFTs. AST and ALT normal which is great news. No further labs need to be drawn at this time. I would repeat LFTs in one year to ensure stable. Thanks

## 2017-12-22 ENCOUNTER — Other Ambulatory Visit: Payer: Self-pay | Admitting: Obstetrics and Gynecology

## 2017-12-22 DIAGNOSIS — Z1231 Encounter for screening mammogram for malignant neoplasm of breast: Secondary | ICD-10-CM

## 2017-12-27 DIAGNOSIS — L821 Other seborrheic keratosis: Secondary | ICD-10-CM | POA: Diagnosis not present

## 2017-12-27 DIAGNOSIS — L738 Other specified follicular disorders: Secondary | ICD-10-CM | POA: Diagnosis not present

## 2017-12-27 DIAGNOSIS — L570 Actinic keratosis: Secondary | ICD-10-CM | POA: Diagnosis not present

## 2017-12-27 DIAGNOSIS — L578 Other skin changes due to chronic exposure to nonionizing radiation: Secondary | ICD-10-CM | POA: Diagnosis not present

## 2018-01-20 DIAGNOSIS — L570 Actinic keratosis: Secondary | ICD-10-CM | POA: Diagnosis not present

## 2018-01-25 ENCOUNTER — Ambulatory Visit
Admission: RE | Admit: 2018-01-25 | Discharge: 2018-01-25 | Disposition: A | Discharge status: Home / Self Care | Payer: 59 | Source: Ambulatory Visit | Attending: Obstetrics and Gynecology | Admitting: Obstetrics and Gynecology

## 2018-01-25 DIAGNOSIS — Z1231 Encounter for screening mammogram for malignant neoplasm of breast: Secondary | ICD-10-CM

## 2018-02-21 LAB — HM DIABETES EYE EXAM

## 2018-03-21 ENCOUNTER — Ambulatory Visit: Payer: 59 | Admitting: Family Medicine

## 2018-03-24 ENCOUNTER — Encounter: Payer: Self-pay | Admitting: Family Medicine

## 2018-03-24 ENCOUNTER — Ambulatory Visit: Payer: 59 | Admitting: Family Medicine

## 2018-03-24 VITALS — BP 110/70 | HR 77 | Temp 98.1°F | Ht 63.0 in | Wt 255.0 lb

## 2018-03-24 DIAGNOSIS — Z6841 Body Mass Index (BMI) 40.0 and over, adult: Secondary | ICD-10-CM | POA: Insufficient documentation

## 2018-03-24 DIAGNOSIS — E1169 Type 2 diabetes mellitus with other specified complication: Secondary | ICD-10-CM | POA: Diagnosis not present

## 2018-03-24 DIAGNOSIS — E785 Hyperlipidemia, unspecified: Secondary | ICD-10-CM | POA: Diagnosis not present

## 2018-03-24 DIAGNOSIS — E119 Type 2 diabetes mellitus without complications: Secondary | ICD-10-CM

## 2018-03-24 LAB — BAYER DCA HB A1C WAIVED: HB A1C (BAYER DCA - WAIVED): 6.1 % (ref <7.0)

## 2018-03-24 MED ORDER — PHENTERMINE HCL 37.5 MG PO TABS: 18.7500 mg | ORAL_TABLET | Freq: Every day | ORAL | 0 refills | Status: DC

## 2018-03-24 NOTE — Patient Instructions (Signed)
In preparation for our upcoming appointment regarding weight loss, I would like you to answer the following questions on a separate piece of paper and bring them with you to your appointment.   Why do you want to lose weight?  Does obesity run in your family?  What has been your highest weight/ lowest weight in the past?  What did you weigh when you were 53 years old?  What is your goal weight?  What diets/ medications have you tried and did they work?  What side effects did they have if any?  What are your "eating/ binging" triggers?  Have you ever seen a therapist regarding your eating habits?  Are you willing to?  Would you be willing to see a dietician?  Are you willing to meet with me monthly?  Do you have any personal history of thyroid disease (including thyroid cancer), pancreatitis, diabetes, high blood pressure, high cholesterol, heart attack, stroke?  Do you have any family history of thyroid disease (including thyroid cancer), pancreatitis, diabetes, high blood pressure, high cholesterol, heart attack, stroke?  If so, please list who.  Have you ever been diagnosed or had a mental health disorder (addiction, depression, anxiety, bipolar disorder, schizophrenia, etc)?  Is there family history of mental health disorders (addiction, depression, anxiety, bipolar disorder, schizophrenia, etc)?  Would you be interested in bariatric surgery if you were determined to be a candidate?  Please also bring a 3 day food journal to that appointment.  Document EVERYTHING (including snacks/ candies/ food tastings/ drinks), what time of day you ate them and what you were doing and feeling when you ate/ drink (were you driving/ rushing to get somewhere?  Were you seated at the dinner table/ watching tv?  Were you lonely/ upset/ happy/ celebrating?)  Dulaglutide injection What is this medicine? DULAGLUTIDE (DOO la GLOO tide) is used to improve blood sugar control in adults with type 2  diabetes. This medicine may be used with other oral diabetes medicines. This medicine may be used for other purposes; ask your health care provider or pharmacist if you have questions. COMMON BRAND NAME(S): TRULICITY What should I tell my health care provider before I take this medicine? They need to know if you have any of these conditions: -endocrine tumors (MEN 2) or if someone in your family had these tumors -history of pancreatitis -kidney disease -liver disease -stomach problems -thyroid cancer or if someone in your family had thyroid cancer -an unusual or allergic reaction to dulaglutide, other medicines, foods, dyes, or preservatives -pregnant or trying to get pregnant -breast-feeding How should I use this medicine? This medicine is for injection under the skin of your upper leg (thigh), stomach area, or upper arm. It is usually given once every week (every 7 days). You will be taught how to prepare and give this medicine. Use exactly as directed. Take your medicine at regular intervals. Do not take it more often than directed. If you use this medicine with insulin, you should inject this medicine and the insulin separately. Do not mix them together. Do not give the injections right next to each other. Change (rotate) injection sites with each injection. It is important that you put your used needles and syringes in a special sharps container. Do not put them in a trash can. If you do not have a sharps container, call your pharmacist or healthcare provider to get one. A special MedGuide will be given to you by the pharmacist with each prescription and refill. Be sure  to read this information carefully each time. Talk to your pediatrician regarding the use of this medicine in children. Special care may be needed. Overdosage: If you think you have taken too much of this medicine contact a poison control center or emergency room at once. NOTE: This medicine is only for you. Do not share  this medicine with others. What if I miss a dose? If you miss a dose, take it as soon as you can within 3 days after the missed dose. Then take your next dose at your regular weekly time. If it has been longer than 3 days after the missed dose, do not take the missed dose. Take the next dose at your regular time. Do not take double or extra doses. If you have questions about a missed dose, contact your health care provider for advice. What may interact with this medicine? -other medicines for diabetes Many medications may cause changes in blood sugar, these include: -alcohol containing beverages -antiviral medicines for HIV or AIDS -aspirin and aspirin-like drugs -certain medicines for blood pressure, heart disease, irregular heart beat -chromium -diuretics -female hormones, such as estrogens or progestins, birth control pills -fenofibrate -gemfibrozil -isoniazid -lanreotide -female hormones or anabolic steroids -MAOIs like Carbex, Eldepryl, Marplan, Nardil, and Parnate -medicines for weight loss -medicines for allergies, asthma, cold, or cough -medicines for depression, anxiety, or psychotic disturbances -niacin -nicotine -NSAIDs, medicines for pain and inflammation, like ibuprofen or naproxen -octreotide -pasireotide -pentamidine -phenytoin -probenecid -quinolone antibiotics such as ciprofloxacin, levofloxacin, ofloxacin -some herbal dietary supplements -steroid medicines such as prednisone or cortisone -sulfamethoxazole; trimethoprim -thyroid hormones Some medications can hide the warning symptoms of low blood sugar (hypoglycemia). You may need to monitor your blood sugar more closely if you are taking one of these medications. These include: -beta-blockers, often used for high blood pressure or heart problems (examples include atenolol, metoprolol, propranolol) -clonidine -guanethidine -reserpine This list may not describe all possible interactions. Give your health care  provider a list of all the medicines, herbs, non-prescription drugs, or dietary supplements you use. Also tell them if you smoke, drink alcohol, or use illegal drugs. Some items may interact with your medicine. What should I watch for while using this medicine? Visit your doctor or health care professional for regular checks on your progress. Drink plenty of fluids while taking this medicine. Check with your doctor or health care professional if you get an attack of severe diarrhea, nausea, and vomiting. The loss of too much body fluid can make it dangerous for you to take this medicine. A test called the HbA1C (A1C) will be monitored. This is a simple blood test. It measures your blood sugar control over the last 2 to 3 months. You will receive this test every 3 to 6 months. Learn how to check your blood sugar. Learn the symptoms of low and high blood sugar and how to manage them. Always carry a quick-source of sugar with you in case you have symptoms of low blood sugar. Examples include hard sugar candy or glucose tablets. Make sure others know that you can choke if you eat or drink when you develop serious symptoms of low blood sugar, such as seizures or unconsciousness. They must get medical help at once. Tell your doctor or health care professional if you have high blood sugar. You might need to change the dose of your medicine. If you are sick or exercising more than usual, you might need to change the dose of your medicine. Do not skip meals.  Ask your doctor or health care professional if you should avoid alcohol. Many nonprescription cough and cold products contain sugar or alcohol. These can affect blood sugar. Pens should never be shared. Even if the needle is changed, sharing may result in passing of viruses like hepatitis or HIV. Wear a medical ID bracelet or chain, and carry a card that describes your disease and details of your medicine and dosage times. What side effects may I notice from  receiving this medicine? Side effects that you should report to your doctor or health care professional as soon as possible: -allergic reactions like skin rash, itching or hives, swelling of the face, lips, or tongue -breathing problems -diarrhea that continues or is severe -lump or swelling on the neck -severe nausea -signs and symptoms of infection like fever or chills; cough; sore throat; pain or trouble passing urine -signs and symptoms of low blood sugar such as feeling anxious, confusion, dizziness, increased hunger, unusually weak or tired, sweating, shakiness, cold, irritable, headache, blurred vision, fast heartbeat, loss of consciousness -signs and symptoms of kidney injury like trouble passing urine or change in the amount of urine -trouble swallowing -unusual stomach upset or pain -vomiting Side effects that usually do not require medical attention (report to your doctor or health care professional if they continue or are bothersome): -diarrhea -loss of appetite -nausea -pain, redness, or irritation at site where injected -stomach upset This list may not describe all possible side effects. Call your doctor for medical advice about side effects. You may report side effects to FDA at 1-800-FDA-1088. Where should I keep my medicine? Keep out of the reach of children. Store unopened pens in a refrigerator between 2 and 8 degrees C (36 and 46 degrees F). Do not freeze or use if the medicine has been frozen. Protect from light and excessive heat. Store in the carton until use. Each single-dose pen can be kept at room temperature, not to exceed 30 degrees C (86 degrees F) for a total of 14 days, if needed. Throw away any unused medicine after the expiration date on the label. NOTE: This sheet is a summary. It may not cover all possible information. If you have questions about this medicine, talk to your doctor, pharmacist, or health care provider.  2019 [employer] Standard  (2016-03-18 14:35:01) Liraglutide injection What is this medicine? LIRAGLUTIDE (LIR a GLOO tide) is used to improve blood sugar control in adults with type 2 diabetes. This medicine may be used with other diabetes medicines. This drug may also reduce the risk of heart attack or stroke if you have type 2 diabetes and risk factors for heart disease. This medicine may be used for other purposes; ask your health care provider or pharmacist if you have questions. COMMON BRAND NAME(S): Victoza What should I tell my health care provider before I take this medicine? They need to know if you have any of these conditions: -endocrine tumors (MEN 2) or if someone in your family had these tumors -gallbladder disease -high cholesterol -history of alcohol abuse problem -history of pancreatitis -kidney disease or if you are on dialysis -liver disease -previous swelling of the tongue, face, or lips with difficulty breathing, difficulty swallowing, hoarseness, or tightening of the throat -stomach problems -thyroid cancer or if someone in your family had thyroid cancer -an unusual or allergic reaction to liraglutide, other medicines, foods, dyes, or preservatives -pregnant or trying to get pregnant -breast-feeding How should I use this medicine? This medicine is for injection under the  skin of your upper leg, stomach area, or upper arm. You will be taught how to prepare and give this medicine. Use exactly as directed. Take your medicine at regular intervals. Do not take it more often than directed. It is important that you put your used needles and syringes in a special sharps container. Do not put them in a trash can. If you do not have a sharps container, call your pharmacist or healthcare provider to get one. A special MedGuide will be given to you by the pharmacist with each prescription and refill. Be sure to read this information carefully each time. Talk to your pediatrician regarding the use of this  medicine in children. While this drug may be prescribed for children as young as 38 years of age, precautions do apply. Overdosage: If you think you have taken too much of this medicine contact a poison control center or emergency room at once. NOTE: This medicine is only for you. Do not share this medicine with others. What if I miss a dose? If you miss a dose, take it as soon as you can. If it is almost time for your next dose, take only that dose. Do not take double or extra doses. What may interact with this medicine? -other medicines for diabetes Many medications may cause changes in blood sugar, these include: -alcohol containing beverages -antiviral medicines for HIV or AIDS -aspirin and aspirin-like drugs -certain medicines for blood pressure, heart disease, irregular heart beat -chromium -diuretics -female hormones, such as estrogens or progestins, birth control pills -fenofibrate -gemfibrozil -isoniazid -lanreotide -female hormones or anabolic steroids -MAOIs like Carbex, Eldepryl, Marplan, Nardil, and Parnate -medicines for weight loss -medicines for allergies, asthma, cold, or cough -medicines for depression, anxiety, or psychotic disturbances -niacin -nicotine -NSAIDs, medicines for pain and inflammation, like ibuprofen or naproxen -octreotide -pasireotide -pentamidine -phenytoin -probenecid -quinolone antibiotics such as ciprofloxacin, levofloxacin, ofloxacin -some herbal dietary supplements -steroid medicines such as prednisone or cortisone -sulfamethoxazole; trimethoprim -thyroid hormones Some medications can hide the warning symptoms of low blood sugar (hypoglycemia). You may need to monitor your blood sugar more closely if you are taking one of these medications. These include: -beta-blockers, often used for high blood pressure or heart problems (examples include atenolol, metoprolol, propranolol) -clonidine -guanethidine -reserpine This list may not describe  all possible interactions. Give your health care provider a list of all the medicines, herbs, non-prescription drugs, or dietary supplements you use. Also tell them if you smoke, drink alcohol, or use illegal drugs. Some items may interact with your medicine. What should I watch for while using this medicine? Visit your doctor or health care professional for regular checks on your progress. Drink plenty of fluids while taking this medicine. Check with your doctor or health care professional if you get an attack of severe diarrhea, nausea, and vomiting. The loss of too much body fluid can make it dangerous for you to take this medicine. A test called the HbA1C (A1C) will be monitored. This is a simple blood test. It measures your blood sugar control over the last 2 to 3 months. You will receive this test every 3 to 6 months. Learn how to check your blood sugar. Learn the symptoms of low and high blood sugar and how to manage them. Always carry a quick-source of sugar with you in case you have symptoms of low blood sugar. Examples include hard sugar candy or glucose tablets. Make sure others know that you can choke if you eat or drink when  you develop serious symptoms of low blood sugar, such as seizures or unconsciousness. They must get medical help at once. Tell your doctor or health care professional if you have high blood sugar. You might need to change the dose of your medicine. If you are sick or exercising more than usual, you might need to change the dose of your medicine. Do not skip meals. Ask your doctor or health care professional if you should avoid alcohol. Many nonprescription cough and cold products contain sugar or alcohol. These can affect blood sugar. Pens should never be shared. Even if the needle is changed, sharing may result in passing of viruses like hepatitis or HIV. Wear a medical ID bracelet or chain, and carry a card that describes your disease and details of your medicine and  dosage times. What side effects may I notice from receiving this medicine? Side effects that you should report to your doctor or health care professional as soon as possible: -allergic reactions like skin rash, itching or hives, swelling of the face, lips, or tongue -breathing problems -diarrhea that continues or is severe -lump or swelling on the neck -severe nausea -signs and symptoms of infection like fever or chills; cough; sore throat; pain or trouble passing urine -signs and symptoms of low blood sugar such as feeling anxious, confusion, dizziness, increased hunger, unusually weak or tired, sweating, shakiness, cold, irritable, headache, blurred vision, fast heartbeat, loss of consciousness -signs and symptoms of kidney injury like trouble passing urine or change in the amount of urine -trouble swallowing -unusual stomach upset or pain -vomiting Side effects that usually do not require medical attention (report to your doctor or health care professional if they continue or are bothersome): -constipation -decreased appetite -diarrhea -fatigue -headache -nausea -pain, redness, or irritation at site where injected -stomach upset -stuffy or runny nose This list may not describe all possible side effects. Call your doctor for medical advice about side effects. You may report side effects to FDA at 1-800-FDA-1088. Where should I keep my medicine? Keep out of the reach of children. Store unopened pen in a refrigerator between 2 and 8 degrees C (36 and 46 degrees F). Do not freeze or use if the medicine has been frozen. Protect from light and excessive heat. After you first use the pen, it can be stored at room temperature between 15 and 30 degrees C (59 and 86 degrees F) or in a refrigerator. Throw away your used pen after 30 days or after the expiration date, whichever comes first. Do not store your pen with the needle attached. If the needle is left on, medicine may leak from the  pen. NOTE: This sheet is a summary. It may not cover all possible information. If you have questions about this medicine, talk to your doctor, pharmacist, or health care provider.  2019 [employer] Standard (2017-08-31 15:46:47) Lisdexamfetamine Oral Capsule What is this medicine? LISDEXAMFETAMINE (lis DEX am fet a meen) is used to treat attention-deficit hyperactivity disorder (ADHD) in adults and children. It is also used to treat binge-eating disorder in adults. Federal law prohibits giving this medicine to any person other than the person for whom it was prescribed. Do not share this medicine with anyone else. This medicine may be used for other purposes; ask your health care provider or pharmacist if you have questions. COMMON BRAND NAME(S): Vyvanse What should I tell my health care provider before I take this medicine? They need to know if you have any of these conditions: -anxiety or  panic attacks -circulation problems in fingers and toes -glaucoma -hardening or blockages of the arteries or heart blood vessels -heart disease or a heart defect -high blood pressure -history of a drug or alcohol abuse problem -history of stroke -kidney disease -liver disease -mental illness -seizures -suicidal thoughts, plans, or attempt; a previous suicide attempt by you or a family member -thyroid disease -Tourette's syndrome -an unusual or allergic reaction to lisdexamfetamine, other medicines, foods, dyes, or preservatives -pregnant or trying to get pregnant -breast-feeding How should I use this medicine? Take this medicine by mouth. Follow the directions on the prescription label. Swallow the capsules with a drink of water. You may open capsule and add to a glass of water, then drink right away. Take your doses at regular intervals. Do not take your medicine more often than directed. Do not suddenly stop your medicine. You must gradually reduce the dose or you may feel withdrawal effects. Ask  your doctor or health care professional for advice. A special MedGuide will be given to you by the pharmacist with each prescription and refill. Be sure to read this information carefully each time. Talk to your pediatrician regarding the use of this medicine in children. While this drug may be prescribed for children as young as 62 years of age for selected conditions, precautions do apply. Overdosage: If you think you have taken too much of this medicine contact a poison control center or emergency room at once. NOTE: This medicine is only for you. Do not share this medicine with others. What if I miss a dose? If you miss a dose, take it as soon as you can. If it is almost time for your next dose, take only that dose. Do not take double or extra doses. What may interact with this medicine? Do not take this medicine with any of the following medications: -MAOIs like Carbex, Eldepryl, Marplan, Nardil, and Parnate -other stimulant medicines for attention disorders, weight loss, or to stay awake This medicine may also interact with the following medications: -acetazolamide -ammonium chloride -antacids -ascorbic acid -atomoxetine -caffeine -certain medicines for blood pressure -certain medicines for depression, anxiety, or psychotic disturbances -certain medicines for seizures like carbamazepine, phenobarbital, phenytoin -certain medicines for stomach problems like cimetidine, famotidine, omeprazole, lansoprazole -cold or allergy medicines -green tea -levodopa -linezolid -medicines for sleep during surgery -methenamine -norepinephrine -phenothiazines like chlorpromazine, mesoridazine, prochlorperazine, thioridazine -propoxyphene -sodium acid phosphate -sodium bicarbonate This list may not describe all possible interactions. Give your health care provider a list of all the medicines, herbs, non-prescription drugs, or dietary supplements you use. Also tell them if you smoke, drink alcohol,  or use illegal drugs. Some items may interact with your medicine. What should I watch for while using this medicine? Visit your doctor for regular check ups. This prescription requires that you follow special procedures with your doctor and pharmacy. You will need to have a new written prescription from your doctor every time you need a refill. This medicine may affect your concentration, or hide signs of tiredness. Until you know how this medicine affects you, do not drive, ride a bicycle, use machinery, or do anything that needs mental alertness. Tell your doctor or health care professional if this medicine loses its effects, or if you feel you need to take more than the prescribed amount. Do not change your dose without talking to your doctor or health care professional. Decreased appetite is a common side effect when starting this medicine. Eating small, frequent meals or snacks can help.  Talk to your doctor if you continue to have poor eating habits. Height and weight growth of a child taking this medicine will be monitored closely. Do not take this medicine close to bedtime. It may prevent you from sleeping. If you are going to need surgery, a MRI, CT scan, or other procedure, tell your doctor that you are taking this medicine. You may need to stop taking this medicine before the procedure. Tell your doctor or healthcare professional right away if you notice unexplained wounds on your fingers and toes while taking this medicine. You should also tell your healthcare provider if you experience numbness or pain, changes in the skin color, or sensitivity to temperature in your fingers or toes. What side effects may I notice from receiving this medicine? Side effects that you should report to your doctor or health care professional as soon as possible: -allergic reactions like skin rash, itching or hives, swelling of the face, lips, or tongue -changes in vision -chest pain or chest  tightness -confusion, trouble speaking or understanding -fast, irregular heartbeat -fingers or toes feel numb, cool, painful -hallucination, loss of contact with reality -high blood pressure -males: prolonged or painful erection -seizures -severe headaches -shortness of breath -suicidal thoughts or other mood changes -trouble walking, dizziness, loss of balance or coordination -uncontrollable head, mouth, neck, arm, or leg movements Side effects that usually do not require medical attention (report to your doctor or health care professional if they continue or are bothersome): -anxious -headache -loss of appetite -nausea, vomiting -trouble sleeping -weight loss This list may not describe all possible side effects. Call your doctor for medical advice about side effects. You may report side effects to FDA at 1-800-FDA-1088. Where should I keep my medicine? Keep out of the reach of children. This medicine can be abused. Keep your medicine in a safe place to protect it from theft. Do not share this medicine with anyone. Selling or giving away this medicine is dangerous and against the law. Store at room temperature between 15 and 30 degrees C (59 and 86 degrees F). Protect from light. Keep container tightly closed. Throw away any unused medicine after the expiration date. NOTE: This sheet is a summary. It may not cover all possible information. If you have questions about this medicine, talk to your doctor, pharmacist, or health care provider.  2019 Elsevier/Gold Standard (2014-01-02 19:20:14)

## 2018-03-24 NOTE — Progress Notes (Signed)
Subjective: CC: DM2 PCP: Mary Norlander, DO AJG:OTLXB Mary [name] is a 53 y.o. female presenting to clinic today for:  1. Type 2 Diabetes with hyperlipidemia Patient reports FBG: 110-140. Taking medication(s): Metformin 530m BID, Lipitor 40 mg daily side effects: none  Last eye exam: sees my eye doctor regularly. No retinopathy. Will have records sent Last foot exam: UTD; due 08/2017 Last A1c: 6.0; 12/2017 Nephropathy screen indicated?: done 12/2017 Last flu, zoster and/or pneumovax: needs PNA  ROS: denies fever, chills, dizziness, LOC, polyuria, polydipsia, foot ulcerations, numbness or tingling in extremities or chest pain.  2. Obesity Patient reports quite a bit of difficulty losing weight despite diet modification.  [name] and has a nurse at work which also provides nutritional seminars and information.  She tries to practice a low-carb diet and increase physical fitness.  She is tried weight watchers in the past and an over-the-counter weight loss supplement with little success.  She has a goal of weighing less than 200 pounds.  No personal history of thyroid disorder, MEN tumors, pancreatitis.  No family history of MEN tumors.  She is interested in pursuing pharmacologic intervention for weight loss.   ROS: Per HPI  No Known Allergies Past Medical History:  Diagnosis Date  . Allergy    seasonal  . Arthritis   . GERD (gastroesophageal reflux disease)   . Mononucleosis   . Seasonal allergies   . Viral hepatitis 10/2016    Current Outpatient Medications:  .  atorvastatin (LIPITOR) 40 MG tablet, TAKE ONE (1) TABLET EACH DAY, Disp: 90 tablet, Rfl: 1 .  Blood Glucose Monitoring Suppl (BLOOD GLUCOSE MONITOR SYSTEM) w/Device KIT, 1 Device 2 (two) times daily by Does not apply route., Disp: 1 each, Rfl: 0 .  cetirizine (ZYRTEC) 10 MG tablet, Take 10 mg by mouth daily., Disp: , Rfl:  .  famotidine (PEPCID) 10 MG tablet, Take 10 mg by mouth  daily., Disp: , Rfl:  .  glucose blood test strip, Use as instructed, Disp: 100 each, Rfl: 12 .  ibuprofen (ADVIL,MOTRIN) 200 MG tablet, Take 200 mg by mouth as needed for pain., Disp: , Rfl:  .  Lancets MISC, Use to test blood sugar twice daily., Disp: 100 each, Rfl: 3 .  metFORMIN (GLUCOPHAGE) 500 MG tablet, TAKE ONE TABLET TWICE A DAY WITH FOOD, Disp: 180 tablet, Rfl: 1 .  Multiple Vitamin (MULTIVITAMIN) tablet, Take 1 tablet daily by mouth., Disp: , Rfl:  .  medroxyPROGESTERone (PROVERA) 10 MG tablet, , Disp: , Rfl:  Social History   Socioeconomic History  . Marital status: Married    Spouse name: Not on file  . Number of children: Not on file  . Years of education: Not on file  . Highest education level: Not on file  Occupational History  . Not on file  Social Needs  . Financial resource strain: Not on file  . Food insecurity:    Worry: Not on file    Inability: Not on file  . Transportation needs:    Medical: Not on file    Non-medical: Not on file  Tobacco Use  . Smoking status: Never Smoker  . Smokeless tobacco: Never Used  Substance and Sexual Activity  . Alcohol use: Yes    Comment: occasional  . Drug use: No  . Sexual activity: Not on file  Lifestyle  . Physical activity:    Days per week: Not on file    Minutes per  session: Not on file  . Stress: Not on file  Relationships  . Social connections:    Talks on phone: Not on file    Gets together: Not on file    Attends religious service: Not on file    Active member of club or organization: Not on file    Attends meetings of clubs or organizations: Not on file    Relationship status: Not on file  . Intimate partner violence:    Fear of current or ex partner: Not on file    Emotionally abused: Not on file    Physically abused: Not on file    Forced sexual activity: Not on file  Other Topics Concern  . Not on file  Social History Narrative  . Not on file   Family History  Problem Relation Age of Onset    . Cancer Mother 50       colon  . Diabetes Brother   . Heart disease Paternal Grandmother   . Breast cancer Sister     Objective: Office vital signs reviewed. BP 110/70   Pulse 77   Temp 98.1 F (36.7 C) (Oral)   Ht 5' 3"  (1.6 m)   Wt 255 lb (115.7 kg)   BMI 45.17 kg/m   Physical Examination:  General: Awake, alert, morbidly obese, No acute distress HEENT: Normal, no goiter.  No exophthalmos Cardio: regular rate and rhythm, S1S2 heard, no murmurs appreciated Pulm: clear to auscultation bilaterally, no wheezes, rhonchi or rales; normal work of breathing on room air Extremities: warm, well perfused, No edema, cyanosis or clubbing; +2 pulses bilaterally MSK: Normal gait and station Skin: Dry, intact.  Normal temperature.  Assessment/ Plan: 53 y.o. female   1. Type 2 diabetes mellitus without complication, without long-term current use of insulin ([place]) Diabetes under excellent control with A1c of 6.1 today.  Okay to continue metformin p.o. twice daily.  She has refills through another 3 months.  We will plan to offer her pneumococcal vaccination at next visit again.  She will have her diabetic eye exam results sent to the office by my eye care.  Continue Lipitor - Bayer DCA Hb A1c Waived  2. Hyperlipidemia associated with type 2 diabetes mellitus (HCC) Continue Lipitor  3. Morbid obesity ([place]) We discussed various options including phentermine versus Vyvanse versus Victoza for weight loss.  She does demonstrate binge eating and I think that she would be a good candidate for Vyvanse should she choose to pursue this in the future.  Because she has diabetes she would also be a candidate for Victoza versus Trulicity.  We will hold off on this and she will investigate more about these medications.  For now, we have prescribed phentermine.  She will start with 1/2 tablet every morning.  If in 3 days she has not noticed substantial suppression of her appetite, she can increase to 1  tablet daily.  We discussed possible side effects including dry mouth, constipation, increased energy and even insomnia.  She will follow-up in 1 month or sooner if needed. - phentermine (ADIPEX-P) 37.5 MG tablet; Take 0.5-1 tablets (18.75-37.5 mg total) by mouth daily before breakfast.  Dispense: 30 tablet; Refill: 0  The Narcotic Database has been reviewed.  There were no red flags.     Orders Placed This Encounter  Procedures  . Bayer DCA Hb A1c Waived   Meds ordered this encounter  Medications  . phentermine (ADIPEX-P) 37.5 MG tablet    Sig: Take 0.5-1 tablets (18.75-37.5  mg total) by mouth daily before breakfast.    Dispense:  30 tablet    Refill:  0     Maleka Contino Windell Moulding, DO [place] [phone]

## 2018-03-30 ENCOUNTER — Telehealth: Payer: Self-pay | Admitting: Family Medicine

## 2018-03-30 MED ORDER — OSELTAMIVIR PHOSPHATE 75 MG PO CAPS: 75.0000 mg | ORAL_CAPSULE | Freq: Two times a day (BID) | ORAL | 0 refills | Status: DC

## 2018-03-30 NOTE — Telephone Encounter (Signed)
I sent Tamiflu for, take it only if she starts to develop fevers or symptoms.

## 2018-04-17 ENCOUNTER — Ambulatory Visit (INDEPENDENT_AMBULATORY_CARE_PROVIDER_SITE_OTHER): Payer: 59 | Admitting: Nurse Practitioner

## 2018-04-17 ENCOUNTER — Encounter: Payer: Self-pay | Admitting: Nurse Practitioner

## 2018-04-17 VITALS — BP 117/77 | HR 77 | Temp 98.0°F | Ht 63.0 in | Wt 249.0 lb

## 2018-04-17 DIAGNOSIS — J0100 Acute maxillary sinusitis, unspecified: Secondary | ICD-10-CM | POA: Diagnosis not present

## 2018-04-17 MED ORDER — AMOXICILLIN-POT CLAVULANATE 875-125 MG PO TABS: 1.0000 | ORAL_TABLET | Freq: Two times a day (BID) | ORAL | 0 refills | Status: DC

## 2018-04-17 NOTE — Patient Instructions (Signed)

## 2018-04-17 NOTE — Progress Notes (Signed)
Subjective:    Patient ID: Mary [name], female    DOB: March 28, 1965, 53 y.o.   MRN: [id number]   Chief Complaint: Nasal Congestion (Husband had flu 3 weeks ago and she was treated as well) and Cough (productive)   HPI Patient come sin today c/o cough and congestion for 2 weeks. Is gradually getting worse. Ears are stopped up now. Has low grade fever.    Review of Systems  Constitutional: Positive for chills and fever.  HENT: Positive for congestion, ear pain, postnasal drip and sinus pain. Negative for sore throat and trouble swallowing.   Respiratory: Positive for cough (productive- greenish yellow).   Cardiovascular: Negative.   Gastrointestinal: Negative.   Musculoskeletal: Negative.   Neurological: Positive for headaches.  Psychiatric/Behavioral: Negative.   All other systems reviewed and are negative.      Objective:   Physical Exam Constitutional:      General: She is in acute distress (mild).     Appearance: She is obese.  HENT:     Right Ear: Tympanic membrane, ear canal and external ear normal.     Left Ear: Tympanic membrane, ear canal and external ear normal.     Nose: Nasal tenderness, mucosal edema, congestion and rhinorrhea present.     Right Turbinates: Swollen.     Left Turbinates: Swollen.     Right Sinus: Maxillary sinus tenderness present. No frontal sinus tenderness.     Left Sinus: Maxillary sinus tenderness present. No frontal sinus tenderness.     Mouth/Throat:     Lips: Pink.     Pharynx: Oropharynx is clear. No posterior oropharyngeal erythema.  Neck:     Musculoskeletal: Normal range of motion.  Cardiovascular:     Rate and Rhythm: Normal rate and regular rhythm.     Heart sounds: Normal heart sounds.  Pulmonary:     Effort: Pulmonary effort is normal.     Breath sounds: Normal breath sounds.     Comments: Dry tight cough Abdominal:     General: Abdomen is flat.  Lymphadenopathy:     Cervical: No cervical adenopathy.  Skin:  General: Skin is warm.  Neurological:     General: No focal deficit present.     Mental Status: She is alert and oriented to person, place, and time.  Psychiatric:        Mood and Affect: Mood normal.        Behavior: Behavior normal.    BP 117/77   Pulse 77   Temp 98 F (36.7 C) (Oral)   Ht 5\' 3"  (1.6 m)   Wt 249 lb (112.9 kg)   BMI 44.11 kg/m         Assessment & Plan:  Mary [name] in today with chief complaint of Nasal Congestion (Husband had flu 3 weeks ago and she was treated as well) and Cough (productive)   1. Acute non-recurrent maxillary sinusitis 1. Take meds as prescribed 2. Use a cool mist humidifier especially during the winter months and when heat has been humid. 3. Use saline nose sprays frequently 4. Saline irrigations of the nose can be very helpful if done frequently.  * 4X daily for 1 week*  * Use of a nettie pot can be helpful with this. Follow directions with this* 5. Drink plenty of fluids 6. Keep thermostat turn down low 7.For any cough or congestion  Use plain Mucinex- regular strength or max strength is fine   * Children- consult with Pharmacist for dosing  8. For fever or aces or pains- take tylenol or ibuprofen appropriate for age and weight.  * for fevers greater than 101 orally you may alternate ibuprofen and tylenol every  3 hours.   Meds ordered this encounter  Medications  . amoxicillin-clavulanate (AUGMENTIN) 875-125 MG tablet    Sig: Take 1 tablet by mouth 2 (two) times daily.    Dispense:  14 tablet    Refill:  0    Order Specific Question:   Supervising Provider    Answer:   Worthy Rancher [[id number]]   [place], FNP

## 2018-04-25 ENCOUNTER — Encounter: Payer: Self-pay | Admitting: Family Medicine

## 2018-04-25 ENCOUNTER — Ambulatory Visit: Payer: 59 | Admitting: Family Medicine

## 2018-04-25 DIAGNOSIS — Z713 Dietary counseling and surveillance: Secondary | ICD-10-CM | POA: Diagnosis not present

## 2018-04-25 MED ORDER — PHENTERMINE HCL 37.5 MG PO TABS: 37.5000 mg | ORAL_TABLET | Freq: Every day | ORAL | 0 refills | Status: DC

## 2018-04-25 NOTE — Progress Notes (Signed)
Subjective: CC: Follow up weight management appointment HPI: Mary [name] is a 53 y.o. female that presents today for follow up weight loss visit.  Patient here for follow-up weight management appointment.  She identifies goal of feeling better and getting off of diabetic and antilipid medications as her reason for wanting to lose weight.  She notes that her highest weight has been 268 pounds with lowest weight being 158 pounds.  I graduated from high school she was 175 pounds.  She has used weight watchers x2 and over-the-counter green tea supplements in efforts to lose weight.  With weight watchers she had a successful 20 pound weight loss but this was not sustained.  She is also seen nutrition/dietitian in the past, when she was pregnant.  She finds that her triggers for eating include being upset/emotional eating, or being at home and watching TV.  She often eats past the point of fullness specifically at dinnertime.  She skips breakfast most days and has a small solid or fruit for lunch.  Dinner however she frequently overeats.  She has a medical history of anxiety disorder and is treated with Effexor for this.  No history of thyroid disease.  No family history of endocrine disorders, MEN tumors.  She would be interested in consideration for gastric sleeve if medical interventions fail.  Patient reports daily use of phentermine 37.5 mg daily.  Side effects identified: Increased energy.  Denies abdominal pain, nausea, vomiting, diarrhea, chest pain, shortness of breath, heart palpitations, insomnia, headache, dry mouth or constipation.  Fluid intake: Good amount.  Patient is weighing intervally at home.   Past Medical History:  Diagnosis Date  . Allergy    seasonal  . Arthritis   . GERD (gastroesophageal reflux disease)   . Mononucleosis   . Seasonal allergies   . Viral hepatitis 10/2016    Current Outpatient Medications:  .  atorvastatin (LIPITOR) 40 MG tablet, TAKE ONE (1) TABLET  EACH DAY, Disp: 90 tablet, Rfl: 1 .  Blood Glucose Monitoring Suppl (BLOOD GLUCOSE MONITOR SYSTEM) w/Device KIT, 1 Device 2 (two) times daily by Does not apply route., Disp: 1 each, Rfl: 0 .  cetirizine (ZYRTEC) 10 MG tablet, Take 10 mg by mouth daily., Disp: , Rfl:  .  famotidine (PEPCID) 10 MG tablet, Take 10 mg by mouth daily., Disp: , Rfl:  .  glucose blood test strip, Use as instructed, Disp: 100 each, Rfl: 12 .  ibuprofen (ADVIL,MOTRIN) 200 MG tablet, Take 200 mg by mouth as needed for pain., Disp: , Rfl:  .  Lancets MISC, Use to test blood sugar twice daily., Disp: 100 each, Rfl: 3 .  metFORMIN (GLUCOPHAGE) 500 MG tablet, TAKE ONE TABLET TWICE A DAY WITH FOOD, Disp: 180 tablet, Rfl: 1 .  Multiple Vitamin (MULTIVITAMIN) tablet, Take 1 tablet daily by mouth., Disp: , Rfl:  .  phentermine (ADIPEX-P) 37.5 MG tablet, Take 0.5-1 tablets (18.75-37.5 mg total) by mouth daily before breakfast., Disp: 30 tablet, Rfl: 0 .  venlafaxine XR (EFFEXOR-XR) 75 MG 24 hr capsule, , Disp: , Rfl:  Social History   Socioeconomic History  . Marital status: Married    Spouse name: Not on file  . Number of children: Not on file  . Years of education: Not on file  . Highest education level: Not on file  Occupational History  . Not on file  Social Needs  . Financial resource strain: Not on file  . Food insecurity:    Worry: Not on  file    Inability: Not on file  . Transportation needs:    Medical: Not on file    Non-medical: Not on file  Tobacco Use  . Smoking status: Never Smoker  . Smokeless tobacco: Never Used  Substance and Sexual Activity  . Alcohol use: Yes    Comment: occasional  . Drug use: No  . Sexual activity: Not on file  Lifestyle  . Physical activity:    Days per week: Not on file    Minutes per session: Not on file  . Stress: Not on file  Relationships  . Social connections:    Talks on phone: Not on file    Gets together: Not on file    Attends religious service: Not on file      Active member of club or organization: Not on file    Attends meetings of clubs or organizations: Not on file    Relationship status: Not on file  . Intimate partner violence:    Fear of current or ex partner: Not on file    Emotionally abused: Not on file    Physically abused: Not on file    Forced sexual activity: Not on file  Other Topics Concern  . Not on file  Social History Narrative  . Not on file   No Known Allergies Family History  Problem Relation Age of Onset  . Cancer Mother 30       colon  . Diabetes Brother   . Heart disease Paternal Grandmother   . Breast cancer Sister     ROS: Per HPI  Objective: Office vital signs reviewed. BP 118/77   Pulse 77   Temp 97.8 F (36.6 C) (Oral)   Ht _0  (1.6 m)   Wt 247 lb (112 kg)   BMI 43.75 kg/m   Physical Examination:  General: Awake, alert, obese, No acute distress HEENT: Normal, sclera white, MMM Cardio: regular rate and rhythm, S1S2 heard, no murmurs appreciated Pulm: clear to auscultation bilaterally, no wheezes, rhonchi or rales; normal work of breathing on room air Extremities: warm, well perfused, No edema, cyanosis or clubbing; +2 pulses bilaterally Psych: Mood stable, speech normal, affect appropriate, good eye contact  Depression screen [facility] 2/9 04/17/2018 03/24/2018 12/19/2017  Decreased Interest 0 0 0  Down, Depressed, Hopeless 0 0 0  PHQ - 2 Score 0 0 0  Altered sleeping - 0 -  Tired, decreased energy - 0 -  Change in appetite - 0 -  Feeling bad or failure about yourself  - 0 -  Trouble concentrating - 0 -  Moving slowly or fidgety/restless - 0 -  Suicidal thoughts - 0 -  PHQ-9 Score - 0 -  Difficult doing work/chores - Not difficult at all -    No flowsheet data found.  Assessment/ Plan: 53 y.o. femalehere for follow up weight management visit.  1. Morbid obesity ([place]) She has had a successful reduction in weight by about 7 pounds.  She is tolerating the Adipex 37.5 mg daily without  difficulty.  We did discuss starting a food diary with the lose it app.  Counseling performed with dietary choices and avoiding overeating/skipping meals.  Increase oral hydration.  She will follow-up in 4 weeks, sooner if needed. - phentermine (ADIPEX-P) 37.5 MG tablet; Take 1 tablet (37.5 mg total) by mouth daily before breakfast.  Dispense: 30 tablet; Refill: 0  2. Weight loss counseling, encounter for As above  The Narcotic Database has been reviewed.  There  were no red flags.     [name], DO [place] [phone]

## 2018-04-25 NOTE — Patient Instructions (Signed)
Start the [employer].  See me in 1 month for recheck.

## 2018-05-08 ENCOUNTER — Other Ambulatory Visit: Payer: Self-pay | Admitting: Family Medicine

## 2018-05-11 ENCOUNTER — Other Ambulatory Visit: Payer: Self-pay | Admitting: Family Medicine

## 2018-05-11 ENCOUNTER — Other Ambulatory Visit: Payer: Self-pay | Admitting: Physician Assistant

## 2018-05-11 MED ORDER — FLUCONAZOLE 150 MG PO TABS: 150.0000 mg | ORAL_TABLET | Freq: Once | ORAL | 0 refills | Status: AC

## 2018-05-11 NOTE — Telephone Encounter (Signed)
Pt aware.

## 2018-05-11 NOTE — Telephone Encounter (Signed)
Sent diflucan pill

## 2018-05-26 ENCOUNTER — Ambulatory Visit: Payer: 59 | Admitting: Family Medicine

## 2018-06-05 ENCOUNTER — Telehealth (INDEPENDENT_AMBULATORY_CARE_PROVIDER_SITE_OTHER): Payer: 59 | Admitting: Family Medicine

## 2018-06-05 ENCOUNTER — Encounter: Payer: Self-pay | Admitting: Family Medicine

## 2018-06-05 ENCOUNTER — Other Ambulatory Visit: Payer: Self-pay

## 2018-06-05 DIAGNOSIS — E785 Hyperlipidemia, unspecified: Secondary | ICD-10-CM | POA: Diagnosis not present

## 2018-06-05 DIAGNOSIS — J301 Allergic rhinitis due to pollen: Secondary | ICD-10-CM

## 2018-06-05 DIAGNOSIS — E1169 Type 2 diabetes mellitus with other specified complication: Secondary | ICD-10-CM | POA: Diagnosis not present

## 2018-06-05 DIAGNOSIS — E119 Type 2 diabetes mellitus without complications: Secondary | ICD-10-CM

## 2018-06-05 MED ORDER — ATORVASTATIN CALCIUM 40 MG PO TABS: ORAL_TABLET | ORAL | 1 refills | Status: DC

## 2018-06-05 MED ORDER — METFORMIN HCL 500 MG PO TABS: ORAL_TABLET | ORAL | 1 refills | Status: DC

## 2018-06-05 MED ORDER — PHENTERMINE HCL 37.5 MG PO TABS: 37.5000 mg | ORAL_TABLET | Freq: Every day | ORAL | 1 refills | Status: DC

## 2018-06-05 NOTE — Patient Instructions (Signed)
I have sent in your refills.  Let me know if your sinus symptoms do not improve with the regimen we discussed.  To recap, use Pepcid daily.  Okay to continue Zyrtec nightly and Flonase daily as directed.  If symptoms persist, we can consider adding something called Singulair.  If you have any infectious type symptoms, please contact me.  Hopefully we can reconvene with regards to weight and diabetes in the next couple of months.

## 2018-06-05 NOTE — Progress Notes (Signed)
Telephone visit  Subjective: CC: f/u obesity/ DM2 PCP: [name], DO UYQ:IHKVQ Mary [name] is a 53 y.o. female calls for telephone consult today. Patient provides verbal consent for consult held via phone.  Location of patient: Work Location of provider: [facility] Others present for call: none  1. Obesity/  DM2 with hyperlipidemia Patient has seen reduction in weight with phentermine 37.5 mg daily.  She reports some dry mouth that is most notable during the morning times but is relieved by intake of water.  She reports adequate water intake.  She notes good energy.  Weight today at home was 238 pounds.  Clothing is fitting more comfortably.  She denies any constipation, heart palpitations, insomnia or increased anxiety.  She does still have issues with snacking in the evening time but she is continue to work on this.  She reports compliance with metformin and Lipitor.  She states that blood sugars are running pretty good with a high of 130 and no hypoglycemic episodes.  No numbness, tingling, visual disturbance.  No chest pain or shortness of breath.  2.  Postnasal drip Patient reports ongoing postnasal drip and sinus drainage despite use of Zyrtec and Flonase.  She denies any fevers, shortness of breath or wheeze.  She is wondering what else she can do to control symptoms.  Of note she is prescribed Pepcid 10 mg as needed acid reflux but she does not use this regularly.   ROS: Per HPI  No Known Allergies Past Medical History:  Diagnosis Date  . Allergy    seasonal  . Arthritis   . GERD (gastroesophageal reflux disease)   . Mononucleosis   . Seasonal allergies   . Viral hepatitis 10/2016    Current Outpatient Medications:  .  atorvastatin (LIPITOR) 40 MG tablet, TAKE ONE (1) TABLET EACH DAY, Disp: 90 tablet, Rfl: 1 .  Blood Glucose Monitoring Suppl (BLOOD GLUCOSE MONITOR SYSTEM) w/Device KIT, 1 Device 2 (two) times daily by Does not apply route., Disp: 1 each, Rfl: 0 .   cetirizine (ZYRTEC) 10 MG tablet, Take 10 mg by mouth daily., Disp: , Rfl:  .  famotidine (PEPCID) 10 MG tablet, Take 10 mg by mouth daily., Disp: , Rfl:  .  glucose blood test strip, Use as instructed, Disp: 100 each, Rfl: 12 .  ibuprofen (ADVIL,MOTRIN) 200 MG tablet, Take 200 mg by mouth as needed for pain., Disp: , Rfl:  .  Lancets MISC, Use to test blood sugar twice daily., Disp: 100 each, Rfl: 3 .  metFORMIN (GLUCOPHAGE) 500 MG tablet, TAKE ONE TABLET TWICE A DAY WITH FOOD, Disp: 180 tablet, Rfl: 1 .  Multiple Vitamin (MULTIVITAMIN) tablet, Take 1 tablet daily by mouth., Disp: , Rfl:  .  phentermine (ADIPEX-P) 37.5 MG tablet, Take 1 tablet (37.5 mg total) by mouth daily before breakfast., Disp: 30 tablet, Rfl: 0 .  venlafaxine XR (EFFEXOR-XR) 75 MG 24 hr capsule, , Disp: , Rfl:   Assessment/ Plan: 53 y.o. female   1. Morbid obesity ([place]) From her report, she continues to lose weight.  No symptoms except for mild cottonmouth relieved by water.  The narcotic database was reviewed and there were no red flags.  Last refill of phentermine was 05/08/2018.  I have sent in refills of the Adipex.  We will plan to reconvene in a couple of months if able. - phentermine (ADIPEX-P) 37.5 MG tablet; Take 1 tablet (37.5 mg total) by mouth daily before breakfast.  Dispense: 30 tablet; Refill: 1  2. Type 2 diabetes mellitus without complication, without long-term current use of insulin (HCC) Her blood sugars sound fairly controlled.  I have refilled her metformin.  Plan for recheck A1c at next visit - metFORMIN (GLUCOPHAGE) 500 MG tablet; TAKE ONE TABLET TWICE A DAY WITH FOOD  Dispense: 180 tablet; Refill: 1  3. Hyperlipidemia associated with type 2 diabetes mellitus (HCC) Stable.  Not due for fasting lipid panel.  Lipitor recent - atorvastatin (LIPITOR) 40 MG tablet; TAKE ONE (1) TABLET EACH DAY  Dispense: 90 tablet; Refill: 1  4. Non-seasonal allergic rhinitis due to pollen Continue current regimen  with Claritin, Flonase.  We discussed scheduling Pepcid for antihistamine response.  If symptoms persist or do not respond, we discussed consideration for Singulair.  She will contact me at the office if needed.  Start time: 3:39pm End time: 3:42 pm  No orders of the defined types were placed in this encounter.   [name], DO [place] [phone]

## 2018-07-14 DIAGNOSIS — Z01419 Encounter for gynecological examination (general) (routine) without abnormal findings: Secondary | ICD-10-CM | POA: Diagnosis not present

## 2018-07-14 DIAGNOSIS — Z6841 Body Mass Index (BMI) 40.0 and over, adult: Secondary | ICD-10-CM | POA: Diagnosis not present

## 2018-07-18 ENCOUNTER — Other Ambulatory Visit: Payer: Self-pay | Admitting: Family Medicine

## 2018-08-15 ENCOUNTER — Telehealth: Payer: Self-pay | Admitting: Family Medicine

## 2018-08-28 ENCOUNTER — Other Ambulatory Visit: Payer: Self-pay

## 2018-08-29 ENCOUNTER — Ambulatory Visit: Payer: 59 | Admitting: Family Medicine

## 2018-08-29 VITALS — BP 119/85 | HR 83 | Temp 98.6°F | Ht 63.0 in | Wt 248.0 lb

## 2018-08-29 DIAGNOSIS — E119 Type 2 diabetes mellitus without complications: Secondary | ICD-10-CM | POA: Diagnosis not present

## 2018-08-29 DIAGNOSIS — E1169 Type 2 diabetes mellitus with other specified complication: Secondary | ICD-10-CM | POA: Diagnosis not present

## 2018-08-29 DIAGNOSIS — E785 Hyperlipidemia, unspecified: Secondary | ICD-10-CM

## 2018-08-29 LAB — BAYER DCA HB A1C WAIVED: HB A1C (BAYER DCA - WAIVED): 6.1 % (ref <7.0)

## 2018-08-29 MED ORDER — NOVOFINE 32G X 6 MM MISC: 2 refills | Status: DC

## 2018-08-29 MED ORDER — VICTOZA 18 MG/3ML ~~LOC~~ SOPN: PEN_INJECTOR | SUBCUTANEOUS | 0 refills | Status: DC

## 2018-08-29 NOTE — Progress Notes (Signed)
Subjective: CC: DM2 PCP: [name], DO KKX:FGHWE LAKELYNN SEVERTSON is a 53 y.o. female presenting to clinic today for:  1. Type 2 Diabetes with hyperlipidemia Patient reports that she has not been doing as good on diet since she worked at home for 10 weeks during [place].  Taking medication(s): Metformin 564m BID, Lipitor 40 mg daily side effects: none  Last eye exam: sees my eye doctor regularly. No retinopathy. Last foot exam: due Last A1c: 6.0; 12/2017 Nephropathy screen indicated?: done 12/2017 Last flu, zoster and/or pneumovax: Needs PNA vaccine Immunization History  Administered Date(s) Administered  . Influenza-Unspecified 12/07/2017  . Tdap 08/30/2017   ROS: Denies CP, SOB, edema, dizziness.  2. Obesity No personal history of thyroid disorder, MEN tumors, pancreatitis.  No family history of MEN tumors.   Patient reports that phentermine helped quite a bit initially but no longer is effective.  She has not been able to control portion sizes and has not been monitoring her diet, particularly since working at home.  She is interested in pursuing other medications.  She is considered Qsymia but is unsure that her insurance will pay for it.  She is a type II diabetic.  She would be amenable to injectables.  ROS: Per HPI  No Known Allergies Past Medical History:  Diagnosis Date  . Allergy    seasonal  . Arthritis   . GERD (gastroesophageal reflux disease)   . Mononucleosis   . Seasonal allergies   . Viral hepatitis 10/2016    Current Outpatient Medications:  .  atorvastatin (LIPITOR) 40 MG tablet, TAKE ONE (1) TABLET EACH DAY, Disp: 90 tablet, Rfl: 1 .  Blood Glucose Monitoring Suppl (BLOOD GLUCOSE MONITOR SYSTEM) w/Device KIT, 1 Device 2 (two) times daily by Does not apply route., Disp: 1 each, Rfl: 0 .  cetirizine (ZYRTEC) 10 MG tablet, Take 10 mg by mouth daily., Disp: , Rfl:  .  famotidine (PEPCID) 10 MG tablet, Take 10 mg by mouth daily., Disp: , Rfl:  .   glucose blood test strip, Use as instructed, Disp: 100 each, Rfl: 12 .  ibuprofen (ADVIL,MOTRIN) 200 MG tablet, Take 200 mg by mouth as needed for pain., Disp: , Rfl:  .  Lancets MISC, Use to test blood sugar twice daily., Disp: 100 each, Rfl: 3 .  metFORMIN (GLUCOPHAGE) 500 MG tablet, TAKE ONE TABLET TWICE A DAY WITH FOOD, Disp: 180 tablet, Rfl: 1 .  Multiple Vitamin (MULTIVITAMIN) tablet, Take 1 tablet daily by mouth., Disp: , Rfl:  .  phentermine (ADIPEX-P) 37.5 MG tablet, Take 1 tablet (37.5 mg total) by mouth daily before breakfast., Disp: 30 tablet, Rfl: 1 .  venlafaxine XR (EFFEXOR-XR) 75 MG 24 hr capsule, , Disp: , Rfl:  Social History   Socioeconomic History  . Marital status: Married    Spouse name: Not on file  . Number of children: Not on file  . Years of education: Not on file  . Highest education level: Not on file  Occupational History  . Not on file  Social Needs  . Financial resource strain: Not on file  . Food insecurity    Worry: Not on file    Inability: Not on file  . Transportation needs    Medical: Not on file    Non-medical: Not on file  Tobacco Use  . Smoking status: Never Smoker  . Smokeless tobacco: Never Used  Substance and Sexual Activity  . Alcohol use: Yes    Comment: occasional  .  Drug use: No  . Sexual activity: Not on file  Lifestyle  . Physical activity    Days per week: Not on file    Minutes per session: Not on file  . Stress: Not on file  Relationships  . Social Herbalist on phone: Not on file    Gets together: Not on file    Attends religious service: Not on file    Active member of club or organization: Not on file    Attends meetings of clubs or organizations: Not on file    Relationship status: Not on file  . Intimate partner violence    Fear of current or ex partner: Not on file    Emotionally abused: Not on file    Physically abused: Not on file    Forced sexual activity: Not on file  Other Topics Concern  .  Not on file  Social History Narrative  . Not on file   Family History  Problem Relation Age of Onset  . Cancer Mother 29       colon  . Diabetes Brother   . Heart disease Paternal Grandmother   . Breast cancer Sister     Objective: Office vital signs reviewed. BP 119/85   Pulse 83   Temp 98.6 F (37 C) (Oral)   Ht 5' 3" (1.6 m)   Wt 248 lb (112.5 kg)   BMI 43.93 kg/m   Physical Examination:  General: Awake, alert, morbidly obese, No acute distress HEENT: Normal, sclera white, MMM Cardio: regular rate and rhythm, S1S2 heard, no murmurs appreciated Pulm: clear to auscultation bilaterally, no wheezes, rhonchi or rales; normal work of breathing on room air Extremities: warm, well perfused, No edema, cyanosis or clubbing; +2 pulses bilaterally MSK: Normal gait and station Skin: Dry, intact. Neuro: see DM foot exam below  Diabetic Foot Exam - Simple   Simple Foot Form Diabetic Foot exam was performed with the following findings: Yes 08/29/2018  9:02 AM  Visual Inspection No deformities, no ulcerations, no other skin breakdown bilaterally: Yes Sensation Testing Intact to touch and monofilament testing bilaterally: Yes Pulse Check Posterior Tibialis and Dorsalis pulse intact bilaterally: Yes Comments    Assessment/ Plan: 53 y.o. female   1. Type 2 diabetes mellitus without complication, without long-term current use of insulin (HCC) Check A1c.  Have added Victoza in efforts to improve glycemic control and assist with weight loss; she is to continue metformin twice daily as prescribed.  We discussed the possible side effects of the medication.  She will follow-up with me in 1 month for interval check of weight and blood sugar. - Bayer DCA Hb A1c Waived - Basic Metabolic Panel - liraglutide (VICTOZA) 18 MG/3ML SOPN; Inject 0.1 mLs (0.6 mg total) into the skin daily for 28 days, THEN 0.2 mLs (1.2 mg total) daily.  Dispense: 15 mL; Refill: 0 - Insulin Pen Needle (NOVOFINE)  32G X 6 MM MISC; Use to inject Victoza once daily  Dispense: 100 each; Refill: 2  2. Hyperlipidemia associated with type 2 diabetes mellitus (HCC) Continue statin - Basic Metabolic Panel  3. Morbid obesity ([place]) Discussed alternatives including Qsymia.  May consider transitioning to once weekly injectable if patient finds the Victoza helpful.  Would consider Ozempic. - Basic Metabolic Panel - TSH - liraglutide (VICTOZA) 18 MG/3ML SOPN; Inject 0.1 mLs (0.6 mg total) into the skin daily for 28 days, THEN 0.2 mLs (1.2 mg total) daily.  Dispense: 15 mL; Refill: 0 -  Insulin Pen Needle (NOVOFINE) 32G X 6 MM MISC; Use to inject Victoza once daily  Dispense: 100 each; Refill: 2    Orders Placed This Encounter  Procedures  . Bayer DCA Hb A1c Waived  . Basic Metabolic Panel  . TSH   Meds ordered this encounter  Medications  . liraglutide (VICTOZA) 18 MG/3ML SOPN    Sig: Inject 0.1 mLs (0.6 mg total) into the skin daily for 28 days, THEN 0.2 mLs (1.2 mg total) daily.    Dispense:  15 mL    Refill:  0  . Insulin Pen Needle (NOVOFINE) 32G X 6 MM MISC    Sig: Use to inject Victoza once daily    Dispense:  100 each    Refill:  [place], [place] [phone]

## 2018-08-29 NOTE — Patient Instructions (Signed)
We are adding Victoza to inject once daily for diabetes and weight loss.  See me in 1 month for check in.

## 2018-08-30 LAB — BASIC METABOLIC PANEL
BUN/Creatinine Ratio: 22 (ref 9–23)
BUN: 17 mg/dL (ref 6–24)
CO2: 25 mmol/L (ref 20–29)
Calcium: 9.1 mg/dL (ref 8.7–10.2)
Chloride: 104 mmol/L (ref 96–106)
Creatinine, Ser: 0.76 mg/dL (ref 0.57–1.00)
GFR calc Af Amer: 104 mL/min/{1.73_m2} (ref >59)
GFR calc non Af Amer: 90 mL/min/{1.73_m2} (ref >59)
Glucose: 147 mg/dL — ABNORMAL HIGH (ref 65–99)
Potassium: 4.7 mmol/L (ref 3.5–5.2)
Sodium: 140 mmol/L (ref 134–144)

## 2018-08-30 LAB — TSH: TSH: 2.2 u[IU]/mL (ref 0.450–4.500)

## 2018-10-19 ENCOUNTER — Telehealth: Payer: Self-pay | Admitting: Family Medicine

## 2018-10-19 NOTE — Telephone Encounter (Signed)
Patient states she was started on Victoza to help with appetite.  Patient increased to higher dose 2 weeks ago and has not seen any change in appetite.

## 2018-10-20 NOTE — Telephone Encounter (Signed)
Patient has a follow up appointment scheduled. 

## 2018-10-20 NOTE — Telephone Encounter (Signed)
If she is seeing no difference in weight/ blood sugar, ok to discontinue if she would like.  Follow up in office for weight check and to discuss alternatives if she desires alternative medication.

## 2018-10-24 ENCOUNTER — Ambulatory Visit (INDEPENDENT_AMBULATORY_CARE_PROVIDER_SITE_OTHER): Payer: 59 | Admitting: Physician Assistant

## 2018-10-24 ENCOUNTER — Encounter: Payer: Self-pay | Admitting: Physician Assistant

## 2018-10-24 DIAGNOSIS — B889 Infestation, unspecified: Secondary | ICD-10-CM

## 2018-10-24 MED ORDER — PREDNISONE 10 MG (21) PO TBPK: ORAL_TABLET | ORAL | 0 refills | Status: DC

## 2018-10-24 MED ORDER — PERMETHRIN 5 % EX CREA: TOPICAL_CREAM | CUTANEOUS | 0 refills | Status: DC

## 2018-10-24 NOTE — Progress Notes (Signed)
This patient reports that over the past 2 weeks she has had a significant amount of bites on her lower legs.  At first she thought it was mosquito bite.  However they have continued to itch.  They are also on her arms.  She notes that she had gone through some tall grass.  She has never had chigger bites in the past.  After speaking with the pharmacist and you wanted to be which she has had we are going to try some Elimite as a treatment.  She will apply it to the affected areas and sleep with it on overnight, the next morning getting up to washout all off.  She can still use Zyrtec and we will do a small pack of prednisone to help with inflammation.    Telephone visit  Subjective: CC: Tr[place] PCP: [name], DO LSL:HTDSK AYDIA MAJ is a 53 y.o. female calls for telephone consult today. Patient provides verbal consent for consult held via phone.  Patient is identified with 2 separate identifiers.  At this time the entire area is on COVID-19 social distancing and stay home orders are in place.  Patient is of higher risk and therefore we are performing this by a virtual method.  Location of patient: Home Location of provider: HOME Others present for call: No  1   ROS: Per HPI  No Known Allergies Past Medical History:  Diagnosis Date  . Allergy    seasonal  . Arthritis   . GERD (gastroesophageal reflux disease)   . Mononucleosis   . Seasonal allergies   . Viral hepatitis 10/2016    Current Outpatient Medications:  .  atorvastatin (LIPITOR) 40 MG tablet, TAKE ONE (1) TABLET EACH DAY, Disp: 90 tablet, Rfl: 1 .  Blood Glucose Monitoring Suppl (BLOOD GLUCOSE MONITOR SYSTEM) w/Device KIT, 1 Device 2 (two) times daily by Does not apply route., Disp: 1 each, Rfl: 0 .  cetirizine (ZYRTEC) 10 MG tablet, Take 10 mg by mouth daily., Disp: , Rfl:  .  famotidine (PEPCID) 10 MG tablet, Take 10 mg by mouth daily., Disp: , Rfl:  .  glucose blood test strip, Use as instructed, Disp:  100 each, Rfl: 12 .  ibuprofen (ADVIL,MOTRIN) 200 MG tablet, Take 200 mg by mouth as needed for pain., Disp: , Rfl:  .  Insulin Pen Needle (NOVOFINE) 32G X 6 MM MISC, Use to inject Victoza once daily, Disp: 100 each, Rfl: 2 .  Lancets MISC, Use to test blood sugar twice daily., Disp: 100 each, Rfl: 3 .  liraglutide (VICTOZA) 18 MG/3ML SOPN, Inject 0.1 mLs (0.6 mg total) into the skin daily for 28 days, THEN 0.2 mLs (1.2 mg total) daily., Disp: 15 mL, Rfl: 0 .  metFORMIN (GLUCOPHAGE) 500 MG tablet, TAKE ONE TABLET TWICE A DAY WITH FOOD, Disp: 180 tablet, Rfl: 1 .  permethrin (ELIMITE) 5 % cream, Apply to areas at bedtime, leave on overnight, wash in the shower the next morning., Disp: 60 g, Rfl: 0 .  predniSONE (STERAPRED UNI-PAK 21 TAB) 10 MG (21) TBPK tablet, As directed x 6 days, Disp: 21 tablet, Rfl: 0 .  venlafaxine XR (EFFEXOR-XR) 75 MG 24 hr capsule, Take 150 mg by mouth. , Disp: , Rfl:   Assessment/ Plan: 53 y.o. female   1. Chiggers ([place]) - predniSONE (STERAPRED UNI-PAK 21 TAB) 10 MG (21) TBPK tablet; As directed x 6 days  Dispense: 21 tablet; Refill: 0 - permethrin (ELIMITE) 5 % cream; Apply to areas at  bedtime, leave on overnight, wash in the shower the next morning.  Dispense: 60 g; Refill: 0   No follow-ups on file.  Continue all other maintenance medications as listed above.  Start time: 2:58 PM End time: 3:05 PM  Meds ordered this encounter  Medications  . predniSONE (STERAPRED UNI-PAK 21 TAB) 10 MG (21) TBPK tablet    Sig: As directed x 6 days    Dispense:  21 tablet    Refill:  0    Order Specific Question:   Supervising Provider    Answer:   [name] [[id number]]  . permethrin (ELIMITE) 5 % cream    Sig: Apply to areas at bedtime, leave on overnight, wash in the shower the next morning.    Dispense:  60 g    Refill:  0    Order Specific Question:   Supervising Provider    Answer:   [name] [[id number]]    Particia Nearing PA-C [place] [phone]

## 2018-11-15 ENCOUNTER — Ambulatory Visit: Payer: 59 | Admitting: Family Medicine

## 2018-12-01 ENCOUNTER — Other Ambulatory Visit: Payer: Self-pay

## 2018-12-04 ENCOUNTER — Ambulatory Visit: Payer: 59 | Admitting: Family Medicine

## 2018-12-04 ENCOUNTER — Encounter: Payer: Self-pay | Admitting: Family Medicine

## 2018-12-04 ENCOUNTER — Other Ambulatory Visit: Payer: Self-pay

## 2018-12-04 VITALS — BP 125/79 | HR 85 | Temp 98.4°F | Ht 63.0 in | Wt 259.0 lb

## 2018-12-04 DIAGNOSIS — E785 Hyperlipidemia, unspecified: Secondary | ICD-10-CM

## 2018-12-04 DIAGNOSIS — Z23 Encounter for immunization: Secondary | ICD-10-CM

## 2018-12-04 DIAGNOSIS — E119 Type 2 diabetes mellitus without complications: Secondary | ICD-10-CM

## 2018-12-04 DIAGNOSIS — E1169 Type 2 diabetes mellitus with other specified complication: Secondary | ICD-10-CM | POA: Diagnosis not present

## 2018-12-04 LAB — BAYER DCA HB A1C WAIVED: HB A1C (BAYER DCA - WAIVED): 7.1 % — ABNORMAL HIGH (ref <7.0)

## 2018-12-04 MED ORDER — OZEMPIC (0.25 OR 0.5 MG/DOSE) 2 MG/1.5ML ~~LOC~~ SOPN: PEN_INJECTOR | SUBCUTANEOUS | 1 refills | Status: DC

## 2018-12-04 NOTE — Progress Notes (Signed)
Subjective: CC: DM2 PCP: [name], DO HDI:XBOER Mary [name] is a 53 y.o. female presenting to clinic today for:  1. Type 2 Diabetes with hyperlipidemia and morbid obesity Patient reports that she continues to do poorly regarding diet.  She admits to eating quite a bit of sugary treats, citing that her daughter bakes a lot.  She has gained weight and feels it in her clothes.  She does not have a structured exercise regimen. Taking medication(s): Metformin 563m BID, Lipitor 40 mg daily.  She discontinued the Victoza after 2 months because she did not feel that it was helping with sugar nor her weight/appetite.  Last eye exam: sees my eye doctor regularly. No retinopathy. Last foot exam: due Last A1c: 6.1 in 08/2018 Nephropathy screen indicated?: done 12/2017 Last flu, zoster and/or pneumovax: Needs PNA vaccine, flu shot Immunization History  Administered Date(s) Administered  . Influenza-Unspecified 12/07/2017  . Tdap 08/30/2017   ROS: Denies CP, SOB, edema, dizziness.  She had no GI issues with the Victoza.  ROS: Per HPI  No Known Allergies Past Medical History:  Diagnosis Date  . Allergy    seasonal  . Arthritis   . GERD (gastroesophageal reflux disease)   . Mononucleosis   . Seasonal allergies   . Viral hepatitis 10/2016    Current Outpatient Medications:  .  atorvastatin (LIPITOR) 40 MG tablet, TAKE ONE (1) TABLET EACH DAY, Disp: 90 tablet, Rfl: 1 .  Blood Glucose Monitoring Suppl (BLOOD GLUCOSE MONITOR SYSTEM) w/Device KIT, 1 Device 2 (two) times daily by Does not apply route., Disp: 1 each, Rfl: 0 .  cetirizine (ZYRTEC) 10 MG tablet, Take 10 mg by mouth daily., Disp: , Rfl:  .  famotidine (PEPCID) 10 MG tablet, Take 10 mg by mouth daily., Disp: , Rfl:  .  glucose blood test strip, Use as instructed, Disp: 100 each, Rfl: 12 .  ibuprofen (ADVIL,MOTRIN) 200 MG tablet, Take 200 mg by mouth as needed for pain., Disp: , Rfl:  .  Insulin Pen Needle (NOVOFINE) 32G X  6 MM MISC, Use to inject Victoza once daily, Disp: 100 each, Rfl: 2 .  Lancets MISC, Use to test blood sugar twice daily., Disp: 100 each, Rfl: 3 .  liraglutide (VICTOZA) 18 MG/3ML SOPN, Inject 0.1 mLs (0.6 mg total) into the skin daily for 28 days, THEN 0.2 mLs (1.2 mg total) daily., Disp: 15 mL, Rfl: 0 .  metFORMIN (GLUCOPHAGE) 500 MG tablet, TAKE ONE TABLET TWICE A DAY WITH FOOD, Disp: 180 tablet, Rfl: 1 .  venlafaxine XR (EFFEXOR-XR) 75 MG 24 hr capsule, Take 150 mg by mouth. , Disp: , Rfl:  Social History   Socioeconomic History  . Marital status: Married    Spouse name: Not on file  . Number of children: Not on file  . Years of education: Not on file  . Highest education level: Not on file  Occupational History  . Not on file  Social Needs  . Financial resource strain: Not on file  . Food insecurity    Worry: Not on file    Inability: Not on file  . Transportation needs    Medical: Not on file    Non-medical: Not on file  Tobacco Use  . Smoking status: Never Smoker  . Smokeless tobacco: Never Used  Substance and Sexual Activity  . Alcohol use: Yes    Comment: occasional  . Drug use: No  . Sexual activity: Not on file  Lifestyle  . Physical  activity    Days per week: Not on file    Minutes per session: Not on file  . Stress: Not on file  Relationships  . Social Herbalist on phone: Not on file    Gets together: Not on file    Attends religious service: Not on file    Active member of club or organization: Not on file    Attends meetings of clubs or organizations: Not on file    Relationship status: Not on file  . Intimate partner violence    Fear of current or ex partner: Not on file    Emotionally abused: Not on file    Physically abused: Not on file    Forced sexual activity: Not on file  Other Topics Concern  . Not on file  Social History Narrative  . Not on file   Family History  Problem Relation Age of Onset  . Cancer Mother 51       colon   . Diabetes Brother   . Heart disease Paternal Grandmother   . Breast cancer Sister     Objective: Office vital signs reviewed. BP 125/79   Pulse 85   Temp 98.4 F (36.9 C) (Temporal)   Ht _0  (1.6 m)   Wt 259 lb (117.5 kg)   BMI 45.88 kg/m   Physical Examination:  General: Awake, alert, morbidly obese, No acute distress HEENT: Normal, sclera white, MMM Cardio: regular rate and rhythm, S1S2 heard, no murmurs appreciated Pulm: clear to auscultation bilaterally, no wheezes, rhonchi or rales; normal work of breathing on room air Extremities: warm, well perfused, No edema, cyanosis or clubbing; +2 pulses bilaterally MSK: Normal gait and station   Assessment/ Plan: 53 y.o. female   1. Type 2 diabetes mellitus without complication, without long-term current use of insulin (HCC) Now uncontrolled with A1c rapidly rising to 7.1.  Her weight has also gone up about 10 pounds.  We discussed addition of once weekly Ozempic instead of the Victoza.  I have advised her to start with 0.25 mg injected subcu once weekly for 4 weeks then increase to 0.5 mg once weekly.  I also counseled her on carbohydrate reduction and sugar replacement.  I encouraged physical activity.  She will follow-up in the next 3 months, sooner if needed.  Plan for urine microalbumin at next visit. - Bayer DCA Hb A1c Waived - Semaglutide,0.25 or 0.5MG/DOS, (OZEMPIC, 0.25 OR 0.5 MG/DOSE,) 2 MG/1.5ML SOPN; Inject 0.25 mg into the skin once a week for 28 days, THEN 0.5 mg once a week.  Dispense: 1.5 mL; Refill: 1  2. Hyperlipidemia associated with type 2 diabetes mellitus (HCC) Check lipid panel - Lipid Panel - CMP14+EGFR  3. Morbid obesity ([place]) We can consider addition of phentermine in the short-term again, particularly if she does not respond well to the Ozempic. - Semaglutide,0.25 or 0.5MG/DOS, (OZEMPIC, 0.25 OR 0.5 MG/DOSE,) 2 MG/1.5ML SOPN; Inject 0.25 mg into the skin once a week for 28 days, THEN 0.5 mg once a  week.  Dispense: 1.5 mL; Refill: 1   Orders Placed This Encounter  Procedures  . Lipid Panel  . CMP14+EGFR  . Bayer DCA Hb A1c Waived   No orders of the defined types were placed in this encounter.    [name], DO [place] [phone]

## 2018-12-04 NOTE — Patient Instructions (Signed)
Go online to print a coupon for Ozempic.    We discussed reduction in sugar and carbs.   Diabetes Mellitus and Nutrition, Adult When you have diabetes (diabetes mellitus), it is very important to have healthy eating habits because your blood sugar (glucose) levels are greatly affected by what you eat and drink. Eating healthy foods in the appropriate amounts, at about the same times every day, can help you:  Control your blood glucose.  Lower your risk of heart disease.  Improve your blood pressure.  Reach or maintain a healthy weight. Every person with diabetes is different, and each person has different needs for a meal plan. Your health care provider may recommend that you work with a diet and nutrition specialist (dietitian) to make a meal plan that is best for you. Your meal plan may vary depending on factors such as:  The calories you need.  The medicines you take.  Your weight.  Your blood glucose, blood pressure, and cholesterol levels.  Your activity level.  Other health conditions you have, such as heart or kidney disease. How do carbohydrates affect me? Carbohydrates, also called carbs, affect your blood glucose level more than any other type of food. Eating carbs naturally raises the amount of glucose in your blood. Carb counting is a method for keeping track of how many carbs you eat. Counting carbs is important to keep your blood glucose at a healthy level, especially if you use insulin or take certain oral diabetes medicines. It is important to know how many carbs you can safely have in each meal. This is different for every person. Your dietitian can help you calculate how many carbs you should have at each meal and for each snack. Foods that contain carbs include:  Bread, cereal, rice, pasta, and crackers.  Potatoes and corn.  Peas, beans, and lentils.  Milk and yogurt.  Fruit and juice.  Desserts, such as cakes, cookies, ice cream, and candy. How does  alcohol affect me? Alcohol can cause a sudden decrease in blood glucose (hypoglycemia), especially if you use insulin or take certain oral diabetes medicines. Hypoglycemia can be a life-threatening condition. Symptoms of hypoglycemia (sleepiness, dizziness, and confusion) are similar to symptoms of having too much alcohol. If your health care provider says that alcohol is safe for you, follow these guidelines:  Limit alcohol intake to no more than 1 drink per day for nonpregnant women and 2 drinks per day for men. One drink equals 12 oz of beer, 5 oz of wine, or 1 oz of hard liquor.  Do not drink on an empty stomach.  Keep yourself hydrated with water, diet soda, or unsweetened iced tea.  Keep in mind that regular soda, juice, and other mixers may contain a lot of sugar and must be counted as carbs. What are tips for following this plan?  Reading food labels  Start by checking the serving size on the "Nutrition Facts" label of packaged foods and drinks. The amount of calories, carbs, fats, and other nutrients listed on the label is based on one serving of the item. Many items contain more than one serving per package.  Check the total grams (g) of carbs in one serving. You can calculate the number of servings of carbs in one serving by dividing the total carbs by 15. For example, if a food has 30 g of total carbs, it would be equal to 2 servings of carbs.  Check the number of grams (g) of saturated and trans  fats in one serving. Choose foods that have low or no amount of these fats.  Check the number of milligrams (mg) of salt (sodium) in one serving. Most people should limit total sodium intake to less than 2,300 mg per day.  Always check the nutrition information of foods labeled as "low-fat" or "nonfat". These foods may be higher in added sugar or refined carbs and should be avoided.  Talk to your dietitian to identify your daily goals for nutrients listed on the label. Shopping   Avoid buying canned, premade, or processed foods. These foods tend to be high in fat, sodium, and added sugar.  Shop around the outside edge of the grocery store. This includes fresh fruits and vegetables, bulk grains, fresh meats, and fresh dairy. Cooking  Use low-heat cooking methods, such as baking, instead of high-heat cooking methods like deep frying.  Cook using healthy oils, such as olive, canola, or sunflower oil.  Avoid cooking with butter, cream, or high-fat meats. Meal planning  Eat meals and snacks regularly, preferably at the same times every day. Avoid going long periods of time without eating.  Eat foods high in fiber, such as fresh fruits, vegetables, beans, and whole grains. Talk to your dietitian about how many servings of carbs you can eat at each meal.  Eat 4-6 ounces (oz) of lean protein each day, such as lean meat, chicken, fish, eggs, or tofu. One oz of lean protein is equal to: ? 1 oz of meat, chicken, or fish. ? 1 egg. ?  cup of tofu.  Eat some foods each day that contain healthy fats, such as avocado, nuts, seeds, and fish. Lifestyle  Check your blood glucose regularly.  Exercise regularly as told by your health care provider. This may include: ? 150 minutes of moderate-intensity or vigorous-intensity exercise each week. This could be brisk walking, biking, or water aerobics. ? Stretching and doing strength exercises, such as yoga or weightlifting, at least 2 times a week.  Take medicines as told by your health care provider.  Do not use any products that contain nicotine or tobacco, such as cigarettes and e-cigarettes. If you need help quitting, ask your health care provider.  Work with a Social worker or diabetes educator to identify strategies to manage stress and any emotional and social challenges. Questions to ask a health care provider  Do I need to meet with a diabetes educator?  Do I need to meet with a dietitian?  What number can I call if I  have questions?  When are the best times to check my blood glucose? Where to find more information:  American Diabetes Association: diabetes.org  Academy of Nutrition and Dietetics: www.eatright.[employer] of Diabetes and Digestive and Kidney Diseases (NIH): [employer] Summary  A healthy meal plan will help you control your blood glucose and maintain a healthy lifestyle.  Working with a diet and nutrition specialist (dietitian) can help you make a meal plan that is best for you.  Keep in mind that carbohydrates (carbs) and alcohol have immediate effects on your blood glucose levels. It is important to count carbs and to use alcohol carefully. This information is not intended to replace advice given to you by your health care provider. Make sure you discuss any questions you have with your health care provider. Document Released: 11/26/2004 Document Revised: 02/11/2017 Document Reviewed: 04/05/2016 Elsevier Patient Education  2020 [employer].

## 2018-12-05 LAB — CMP14+EGFR
ALT: 19 IU/L (ref 0–32)
AST: 19 IU/L (ref 0–40)
Albumin/Globulin Ratio: 1.7 (ref 1.2–2.2)
Albumin: 4 g/dL (ref 3.8–4.9)
Alkaline Phosphatase: 92 IU/L (ref 39–117)
BUN/Creatinine Ratio: 15 (ref 9–23)
BUN: 10 mg/dL (ref 6–24)
Bilirubin Total: 0.3 mg/dL (ref 0.0–1.2)
CO2: 25 mmol/L (ref 20–29)
Calcium: 8.6 mg/dL — ABNORMAL LOW (ref 8.7–10.2)
Chloride: 105 mmol/L (ref 96–106)
Creatinine, Ser: 0.68 mg/dL (ref 0.57–1.00)
GFR calc Af Amer: 116 mL/min/{1.73_m2} (ref >59)
GFR calc non Af Amer: 101 mL/min/{1.73_m2} (ref >59)
Globulin, Total: 2.4 g/dL (ref 1.5–4.5)
Glucose: 172 mg/dL — ABNORMAL HIGH (ref 65–99)
Potassium: 4.9 mmol/L (ref 3.5–5.2)
Sodium: 142 mmol/L (ref 134–144)
Total Protein: 6.4 g/dL (ref 6.0–8.5)

## 2018-12-05 LAB — LIPID PANEL
Chol/HDL Ratio: 2.8 ratio (ref 0.0–4.4)
Cholesterol, Total: 144 mg/dL (ref 100–199)
HDL: 52 mg/dL (ref >39)
LDL Chol Calc (NIH): 72 mg/dL (ref 0–99)
Triglycerides: 108 mg/dL (ref 0–149)
VLDL Cholesterol Cal: 20 mg/dL (ref 5–40)

## 2018-12-28 ENCOUNTER — Other Ambulatory Visit: Payer: Self-pay | Admitting: Obstetrics and Gynecology

## 2018-12-28 DIAGNOSIS — Z1231 Encounter for screening mammogram for malignant neoplasm of breast: Secondary | ICD-10-CM

## 2019-01-31 IMAGING — MG DIGITAL SCREENING BILATERAL MAMMOGRAM WITH TOMO AND CAD
8 series · 8 of 24 positions shown · non-contrast
Comparison: Previous exam(s).

ACR Breast Density Category a: The breast tissue is almost entirely
fatty.

CLINICAL DATA: Screening.

EXAM:
DIGITAL SCREENING BILATERAL MAMMOGRAM WITH TOMO AND CAD

[R MLO synth-2D]
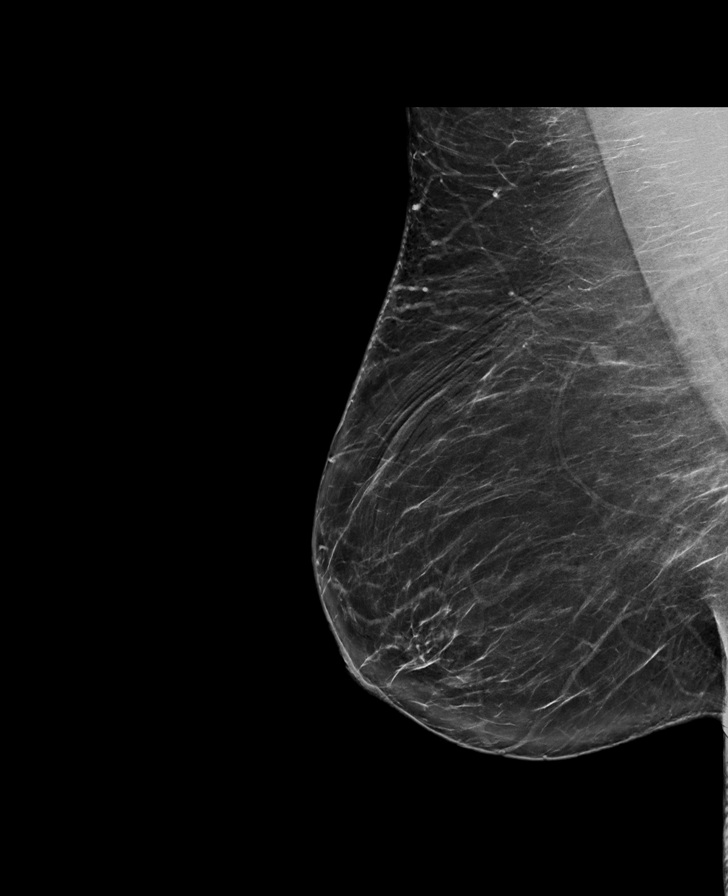

[R CC synth-2D]
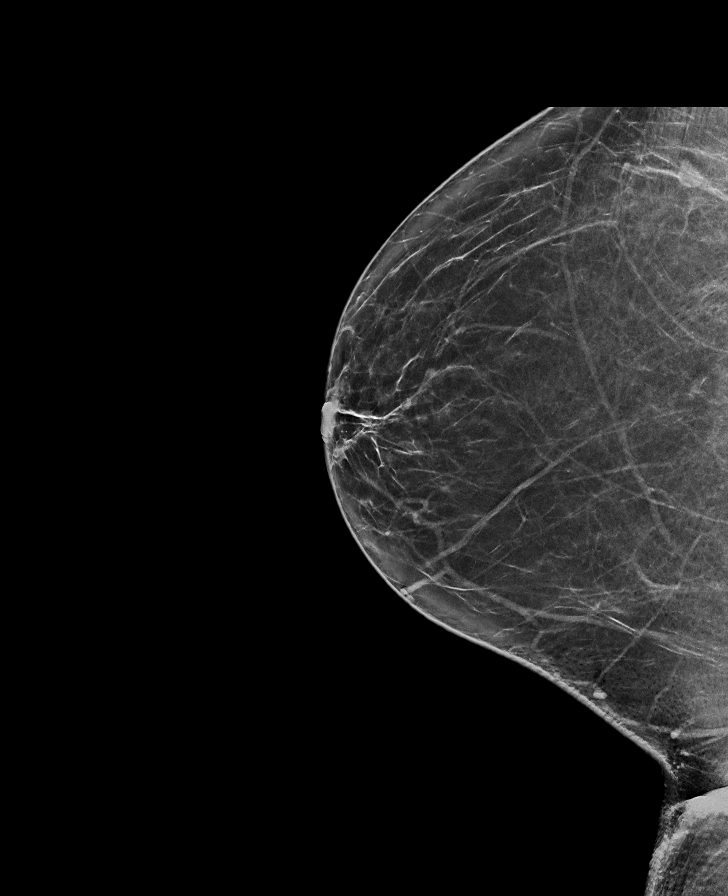

[L MLO synth-2D]
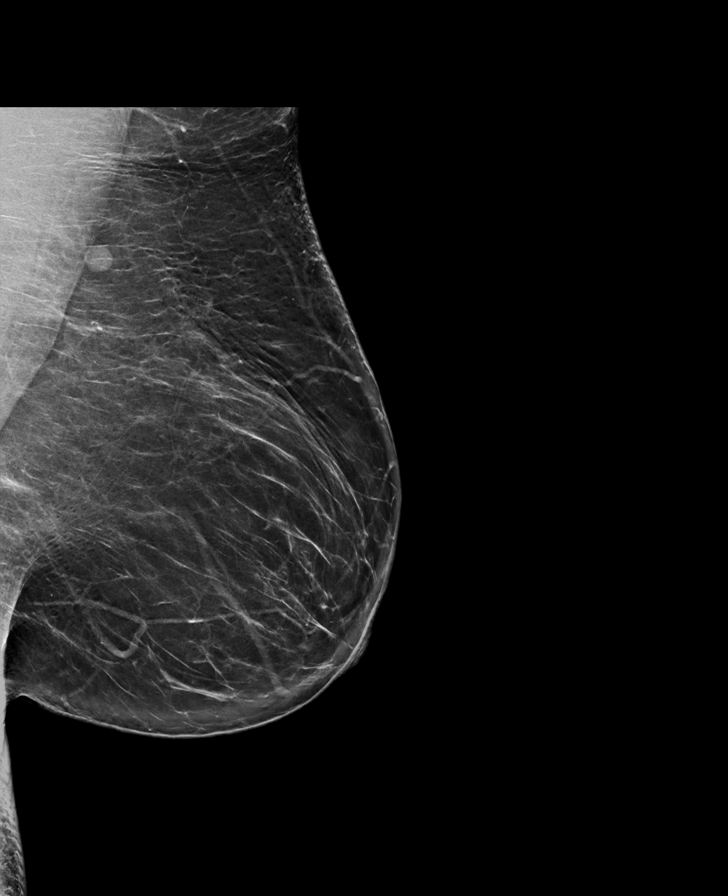

[L CC synth-2D]
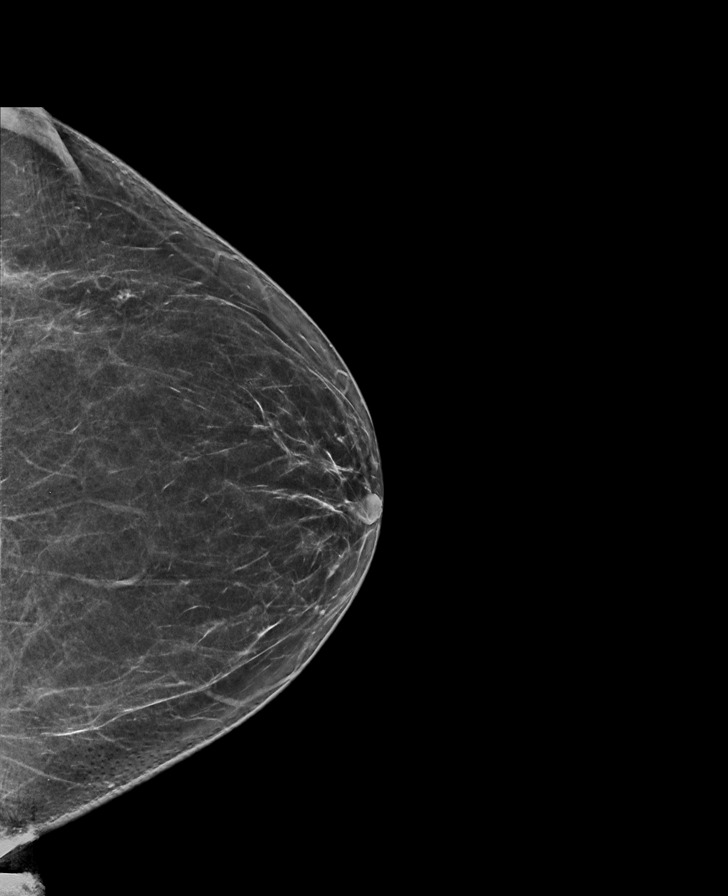

[L CC tomo · tomo slice 37/73.0]
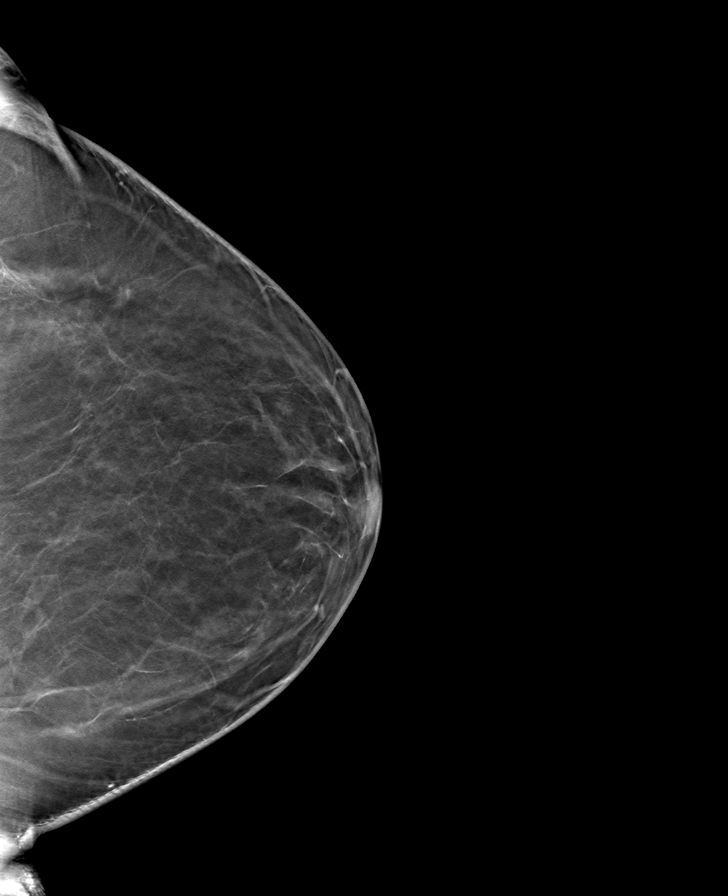

[R CC tomo · tomo slice 38/75.0]
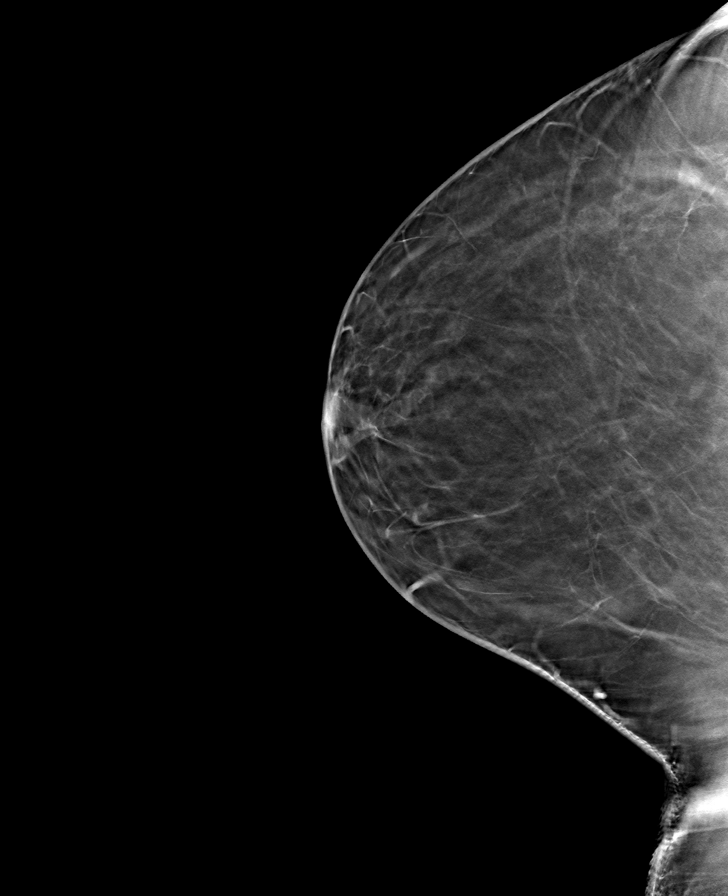

[L MLO tomo · tomo slice 44/87.0]
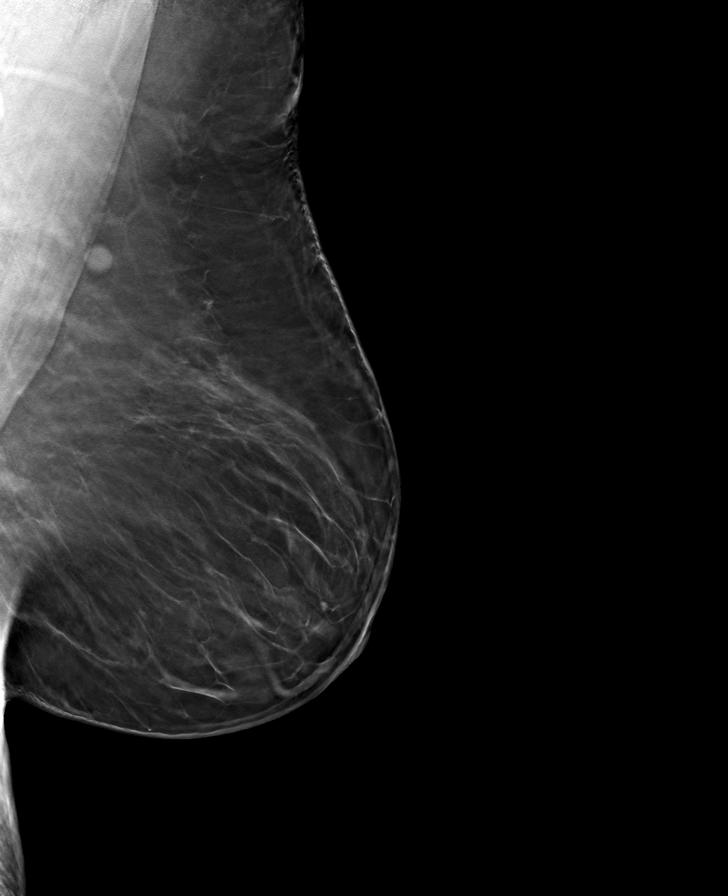

[R MLO tomo · tomo slice 45/88.0]
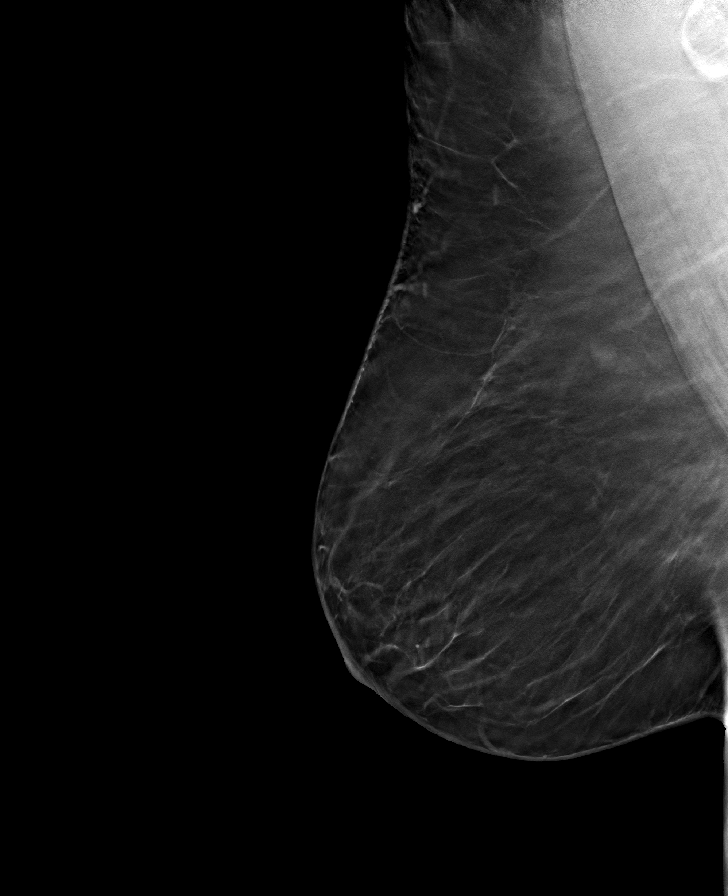

[8 of 24 positions shown; findings below may reference images not displayed]

FINDINGS: There are no findings suspicious for malignancy. Images were
processed with CAD.
IMPRESSION: No mammographic evidence of malignancy. A result letter of this
screening mammogram will be mailed directly to the patient.

RECOMMENDATION:
Screening mammogram in one year. (Code:[id number])

BI-RADS CATEGORY  1: Negative.

## 2019-02-07 ENCOUNTER — Other Ambulatory Visit: Payer: Self-pay | Admitting: Family Medicine

## 2019-02-07 DIAGNOSIS — E119 Type 2 diabetes mellitus without complications: Secondary | ICD-10-CM

## 2019-02-14 ENCOUNTER — Ambulatory Visit
Admission: RE | Admit: 2019-02-14 | Discharge: 2019-02-14 | Disposition: A | Discharge status: Home / Self Care | Payer: 59 | Source: Ambulatory Visit | Attending: Obstetrics and Gynecology | Admitting: Obstetrics and Gynecology

## 2019-02-14 ENCOUNTER — Other Ambulatory Visit: Payer: Self-pay

## 2019-02-14 DIAGNOSIS — Z1231 Encounter for screening mammogram for malignant neoplasm of breast: Secondary | ICD-10-CM

## 2019-03-12 ENCOUNTER — Other Ambulatory Visit: Payer: Self-pay

## 2019-03-12 ENCOUNTER — Encounter: Payer: Self-pay | Admitting: Family Medicine

## 2019-03-12 ENCOUNTER — Ambulatory Visit: Payer: 59 | Admitting: Family Medicine

## 2019-03-12 VITALS — BP 125/84 | HR 84 | Temp 98.0°F | Ht 63.0 in | Wt 253.0 lb

## 2019-03-12 DIAGNOSIS — E1169 Type 2 diabetes mellitus with other specified complication: Secondary | ICD-10-CM | POA: Diagnosis not present

## 2019-03-12 DIAGNOSIS — E119 Type 2 diabetes mellitus without complications: Secondary | ICD-10-CM | POA: Diagnosis not present

## 2019-03-12 DIAGNOSIS — E785 Hyperlipidemia, unspecified: Secondary | ICD-10-CM

## 2019-03-12 LAB — BAYER DCA HB A1C WAIVED: HB A1C (BAYER DCA - WAIVED): 6.3 % (ref <7.0)

## 2019-03-12 NOTE — Progress Notes (Signed)
Subjective: CC: DM2, HLD PCP: [name], DO JJH:ERDEY VIRTIE BUNGERT is a 53 y.o. female presenting to clinic today for:  1. 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  Previously tried Victoza.  [facility].  Blood sugars have been running on average 90 to a max of 140.  She reports good tolerance of the Ozempic and denies any abdominal pain, nausea, vomiting, lightheadedness, drop in blood pressures.  No chest pain, shortness of breath.  She still has not started an exercise regimen nor changed her diet much.  She does note that the Ozempic tends to cause some constipation intermittently but this is always relieved by over-the-counter remedies.  Last eye exam: sees my eye doctor regularly. No retinopathy. Last foot exam: due Last A1c:  Lab Results  Component Value Date   HGBA1C 7.1 (H) 12/04/2018  Nephropathy screen indicated?: needs Last flu, zoster and/or pneumovax: Needs PNA vaccine Immunization History  Administered Date(s) Administered  . Influenza,inj,Quad PF,6+ Mos 12/04/2018  . Influenza-Unspecified 12/07/2017  . Tdap 08/30/2017   ROS: Per HPI  No Known Allergies Past Medical History:  Diagnosis Date  . Allergy    seasonal  . Arthritis   . GERD (gastroesophageal reflux disease)   . Mononucleosis   . Seasonal allergies   . Viral hepatitis 10/2016    Current Outpatient Medications:  .  atorvastatin (LIPITOR) 40 MG tablet, TAKE ONE (1) TABLET EACH DAY, Disp: 90 tablet, Rfl: 1 .  Blood Glucose Monitoring Suppl (BLOOD GLUCOSE MONITOR SYSTEM) w/Device KIT, 1 Device 2 (two) times daily by Does not apply route., Disp: 1 each, Rfl: 0 .  cetirizine (ZYRTEC) 10 MG tablet, Take 10 mg by mouth daily., Disp: , Rfl:  .  famotidine (PEPCID) 10 MG tablet, Take 10 mg by mouth daily., Disp: , Rfl:  .  glucose blood test strip, Use as instructed, Disp: 100 each,  Rfl: 12 .  ibuprofen (ADVIL,MOTRIN) 200 MG tablet, Take 200 mg by mouth as needed for pain., Disp: , Rfl:  .  Lancets MISC, Use to test blood sugar twice daily., Disp: 100 each, Rfl: 3 .  metFORMIN (GLUCOPHAGE) 500 MG tablet, TAKE ONE TABLET TWICE A DAY WITH FOOD, Disp: 180 tablet, Rfl: 1 .  OZEMPIC, 0.25 OR 0.5 MG/DOSE, 2 MG/1.5ML SOPN, INJECT 0.25MG SQ ONCE A WEEK FOR 28 DAYSTHEN 0.5MG SQ ONCE A WEEK, Disp: 1.5 mL, Rfl: 1 .  venlafaxine XR (EFFEXOR-XR) 75 MG 24 hr capsule, Take 150 mg by mouth. , Disp: , Rfl:  Social History   Socioeconomic History  . Marital status: Married    Spouse name: Not on file  . Number of children: Not on file  . Years of education: Not on file  . Highest education level: Not on file  Occupational History  . Not on file  Tobacco Use  . Smoking status: Never Smoker  . Smokeless tobacco: Never Used  Substance and Sexual Activity  . Alcohol use: Yes    Comment: occasional  . Drug use: No  . Sexual activity: Not on file  Other Topics Concern  . Not on file  Social History Narrative  . Not on file   Social Determinants of Health   Financial Resource Strain:   . Difficulty of Paying Living Expenses: Not on file  Food Insecurity:   . Worried About RCharity fundraiserin the Last Year: Not on file  .  Ran Out of Food in the Last Year: Not on file  Transportation Needs:   . Lack of Transportation (Medical): Not on file  . Lack of Transportation (Non-Medical): Not on file  Physical Activity:   . Days of Exercise per Week: Not on file  . Minutes of Exercise per Session: Not on file  Stress:   . Feeling of Stress : Not on file  Social Connections:   . Frequency of Communication with Friends and Family: Not on file  . Frequency of Social Gatherings with Friends and Family: Not on file  . Attends Religious Services: Not on file  . Active Member of Clubs or Organizations: Not on file  . Attends Archivist Meetings: Not on file  . Marital  Status: Not on file  Intimate Partner Violence:   . Fear of Current or Ex-Partner: Not on file  . Emotionally Abused: Not on file  . Physically Abused: Not on file  . Sexually Abused: Not on file   Family History  Problem Relation Age of Onset  . Cancer Mother 27       colon  . Diabetes Brother   . Heart disease Paternal Grandmother   . Breast cancer Sister     Objective: Office vital signs reviewed. BP 125/84   Pulse 84   Temp 98 F (36.7 C) (Temporal)   Ht 5' 3"  (1.6 m)   Wt 253 lb (114.8 kg)   SpO2 97%   BMI 44.82 kg/m   Physical Examination:  General: Awake, alert, morbidly obese, No acute distress HEENT: Normal, sclera white, MMM Cardio: regular rate and rhythm, S1S2 heard, no murmurs appreciated Pulm: clear to auscultation bilaterally, no wheezes, rhonchi or rales; normal work of breathing on room air Extremities: warm, well perfused, No edema, cyanosis or clubbing; +2 pulses bilaterally MSK: Normal gait and station  Assessment/ Plan: 53 y.o. female   1. Type 2 diabetes mellitus without complication, without long-term current use of insulin (HCC) Now under control with Ozempic with A1c of 6.3 today.  Continue current regimen.  Reinforced lifestyle modification.  If she is able to get her A1c under 6, we could trial off of the Ozempic since she is not seeing a great deal of weight loss impact with this.  Check renal function and urine microalbumin. - Bayer DCA Hb A1c Waived - Basic Metabolic Panel - Microalbumin / creatinine urine ratio  2. Hyperlipidemia associated with type 2 diabetes mellitus ([place]) Continue statin  3. Morbid obesity ([place]) As above   No orders of the defined types were placed in this encounter.  No orders of the defined types were placed in this encounter.    [name], DO [place] [phone]

## 2019-03-13 LAB — MICROALBUMIN / CREATININE URINE RATIO
Creatinine, Urine: 217.2 mg/dL
Microalb/Creat Ratio: 8 mg/g creat (ref 0–29)
Microalbumin, Urine: 16.8 ug/mL

## 2019-03-13 LAB — BASIC METABOLIC PANEL
BUN/Creatinine Ratio: 20 (ref 9–23)
BUN: 14 mg/dL (ref 6–24)
CO2: 23 mmol/L (ref 20–29)
Calcium: 9.5 mg/dL (ref 8.7–10.2)
Chloride: 103 mmol/L (ref 96–106)
Creatinine, Ser: 0.71 mg/dL (ref 0.57–1.00)
GFR calc Af Amer: 113 mL/min/{1.73_m2} (ref >59)
GFR calc non Af Amer: 98 mL/min/{1.73_m2} (ref >59)
Glucose: 110 mg/dL — ABNORMAL HIGH (ref 65–99)
Potassium: 4.9 mmol/L (ref 3.5–5.2)
Sodium: 140 mmol/L (ref 134–144)

## 2019-04-20 ENCOUNTER — Other Ambulatory Visit: Payer: Self-pay | Admitting: Family Medicine

## 2019-04-20 DIAGNOSIS — E1169 Type 2 diabetes mellitus with other specified complication: Secondary | ICD-10-CM

## 2019-04-20 DIAGNOSIS — E119 Type 2 diabetes mellitus without complications: Secondary | ICD-10-CM

## 2019-05-02 ENCOUNTER — Other Ambulatory Visit: Payer: Self-pay | Admitting: Family Medicine

## 2019-05-02 DIAGNOSIS — E119 Type 2 diabetes mellitus without complications: Secondary | ICD-10-CM

## 2019-06-08 ENCOUNTER — Other Ambulatory Visit: Payer: Self-pay | Admitting: Family

## 2019-06-08 DIAGNOSIS — T7840XA Allergy, unspecified, initial encounter: Secondary | ICD-10-CM

## 2019-06-08 DIAGNOSIS — L509 Urticaria, unspecified: Secondary | ICD-10-CM

## 2019-06-13 NOTE — Progress Notes (Signed)
MyChart Video visit  Subjective: CC: f/u DM2 PCP: [name], DO Mary [name] is a 54 y.o. female. Patient provides verbal consent for consult held via video.  Due to COVID-19 pandemic this visit was conducted virtually. This visit type was conducted due to national recommendations for restrictions regarding the COVID-19 Pandemic (e.g. social distancing, sheltering in place) in an effort to limit this patient's exposure and mitigate transmission in our community. All issues noted in this document were discussed and addressed.  A physical exam was not performed with this format.   Location of patient: car Location of provider: [facility] Others present for call: none  1. 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 No chest pain, shortness of breath.  Last eye exam: sees my eye doctor regularly. No retinopathy. Last foot exam: UTD Last A1c:  Lab Results  Component Value Date   HGBA1C 6.3 03/12/2019  Nephropathy screen indicated?: UTD Last flu, zoster and/or pneumovax: Needs PNA vaccine Immunization History  Administered Date(s) Administered  . Influenza,inj,Quad PF,6+ Mos 12/04/2018  . Influenza-Unspecified 12/07/2017  . Tdap 08/30/2017   2. Covid exposure Patient reports that she may have been exposed to Covid. Her daughter tested positive recently after going to a baby shower. Patient is status post 12 days from last contact with her daughter. She has been totally asymptomatic but is going to get Covid tested just in case today. She has had 1 out of 2 code vaccines.  ROS: Per HPI  No Known Allergies Past Medical History:  Diagnosis Date  . Allergy    seasonal  . Arthritis   . Diabetes mellitus [place] complication [place]   . GERD (gastroesophageal reflux disease)   . Mononucleosis   . Seasonal allergies   . Viral hepatitis 10/2016    Current Outpatient  Medications:  .  atorvastatin (LIPITOR) 40 MG tablet, TAKE ONE (1) TABLET EACH DAY, Disp: 90 tablet, Rfl: 0 .  Blood Glucose Monitoring Suppl (BLOOD GLUCOSE MONITOR SYSTEM) w/Device KIT, 1 Device 2 (two) times daily by Does not apply route., Disp: 1 each, Rfl: 0 .  cetirizine (ZYRTEC) 10 MG tablet, Take 10 mg by mouth daily., Disp: , Rfl:  .  famotidine (PEPCID) 10 MG tablet, Take 10 mg by mouth daily., Disp: , Rfl:  .  glucose blood test strip, Use as instructed, Disp: 100 each, Rfl: 12 .  ibuprofen (ADVIL,MOTRIN) 200 MG tablet, Take 200 mg by mouth as needed for pain., Disp: , Rfl:  .  Lancets MISC, Use to test blood sugar twice daily., Disp: 100 each, Rfl: 3 .  metFORMIN (GLUCOPHAGE) 500 MG tablet, TAKE ONE TABLET TWICE A DAY WITH FOOD, Disp: 180 tablet, Rfl: 0 .  OZEMPIC, 0.25 OR 0.5 MG/DOSE, 2 MG/1.5ML SOPN, INJECT 0.25MG SQ ONCE A WEEK FOR 28 DAYSTHEN 0.5MG SQ ONCE A WEEK, Disp: 1.5 mL, Rfl: 1 .  venlafaxine XR (EFFEXOR-XR) 75 MG 24 hr capsule, Take 150 mg by mouth. , Disp: , Rfl:   Assessment/ Plan: 54y.o. female   1. Type 2 diabetes mellitus without complication, without long-term current use of insulin (HCC) Stable. Will come in for lab once her covid test is back/ negative. - Bayer DCA Hb A1c Waived; Future  2. Hyperlipidemia associated with type 2 diabetes mellitus ([place]  3. Morbid obesity ([place]  4. Exposure to COVID-19 virus Getting tested this am.   Start time: 8:14am End time: 8:24am  Total time spent on patient care (including video visit/ documentation): 11 minutes  [place], [place] [phone]

## 2019-06-14 ENCOUNTER — Telehealth: Payer: Self-pay | Admitting: Family Medicine

## 2019-06-14 ENCOUNTER — Telehealth (INDEPENDENT_AMBULATORY_CARE_PROVIDER_SITE_OTHER): Payer: 59 | Admitting: Family Medicine

## 2019-06-14 ENCOUNTER — Encounter: Payer: Self-pay | Admitting: Family Medicine

## 2019-06-14 DIAGNOSIS — E1169 Type 2 diabetes mellitus with other specified complication: Secondary | ICD-10-CM | POA: Diagnosis not present

## 2019-06-14 DIAGNOSIS — E119 Type 2 diabetes mellitus without complications: Secondary | ICD-10-CM

## 2019-06-14 DIAGNOSIS — E785 Hyperlipidemia, unspecified: Secondary | ICD-10-CM

## 2019-06-14 DIAGNOSIS — Z20822 Contact with and (suspected) exposure to covid-19: Secondary | ICD-10-CM | POA: Diagnosis not present

## 2019-06-14 MED ORDER — METFORMIN HCL 500 MG PO TABS: 500.0000 mg | ORAL_TABLET | Freq: Two times a day (BID) | ORAL | 3 refills | Status: DC

## 2019-06-14 MED ORDER — OZEMPIC (0.25 OR 0.5 MG/DOSE) 2 MG/1.5ML ~~LOC~~ SOPN: 0.5000 mg | PEN_INJECTOR | SUBCUTANEOUS | 12 refills | Status: DC

## 2019-06-14 MED ORDER — ATORVASTATIN CALCIUM 40 MG PO TABS: 40.0000 mg | ORAL_TABLET | Freq: Every day | ORAL | 3 refills | Status: DC

## 2019-06-14 NOTE — Telephone Encounter (Signed)
Spoke to pt

## 2019-06-14 NOTE — Telephone Encounter (Signed)
Patient states that she had a video visit with Dr. Darnell Level this morning and wants to give her an update.  States that she went to get a rapid COVID test and she tested positive.  States she is still not having any symptoms.  Her work is requiring her to get re tested and have the test sent out to a lab.  Patient just went to get that done and will let us know the results when they come back.

## 2019-08-09 ENCOUNTER — Other Ambulatory Visit: Payer: 59

## 2019-08-09 ENCOUNTER — Other Ambulatory Visit: Payer: Self-pay

## 2019-08-09 DIAGNOSIS — E119 Type 2 diabetes mellitus without complications: Secondary | ICD-10-CM

## 2019-08-09 LAB — BAYER DCA HB A1C WAIVED: HB A1C (BAYER DCA - WAIVED): 6.3 % (ref <7.0)

## 2019-11-14 ENCOUNTER — Ambulatory Visit: Payer: 59 | Admitting: Family Medicine

## 2019-11-20 ENCOUNTER — Ambulatory Visit: Payer: 59 | Admitting: Family Medicine

## 2019-12-11 ENCOUNTER — Other Ambulatory Visit: Payer: Self-pay

## 2019-12-11 ENCOUNTER — Ambulatory Visit: Payer: 59 | Admitting: Family Medicine

## 2019-12-11 VITALS — BP 127/82 | HR 78 | Temp 97.7°F | Ht 63.0 in | Wt 249.0 lb

## 2019-12-11 DIAGNOSIS — E785 Hyperlipidemia, unspecified: Secondary | ICD-10-CM

## 2019-12-11 DIAGNOSIS — E1169 Type 2 diabetes mellitus with other specified complication: Secondary | ICD-10-CM | POA: Diagnosis not present

## 2019-12-11 DIAGNOSIS — Z23 Encounter for immunization: Secondary | ICD-10-CM | POA: Diagnosis not present

## 2019-12-11 DIAGNOSIS — L739 Follicular disorder, unspecified: Secondary | ICD-10-CM

## 2019-12-11 DIAGNOSIS — E119 Type 2 diabetes mellitus without complications: Secondary | ICD-10-CM | POA: Diagnosis not present

## 2019-12-11 LAB — BAYER DCA HB A1C WAIVED: HB A1C (BAYER DCA - WAIVED): 5.7 % (ref <7.0)

## 2019-12-11 MED ORDER — HYDROXYZINE HCL 25 MG PO TABS: 12.5000 mg | ORAL_TABLET | Freq: Three times a day (TID) | ORAL | 0 refills | Status: DC | PRN

## 2019-12-11 MED ORDER — METFORMIN HCL 500 MG PO TABS: 500.0000 mg | ORAL_TABLET | Freq: Every day | ORAL | 3 refills | Status: DC

## 2019-12-11 MED ORDER — TRIAMCINOLONE ACETONIDE 0.1 % EX CREA: 1.0000 "application " | TOPICAL_CREAM | Freq: Two times a day (BID) | CUTANEOUS | 0 refills | Status: DC

## 2019-12-11 MED ORDER — DOXYCYCLINE HYCLATE 100 MG PO TABS: 100.0000 mg | ORAL_TABLET | Freq: Two times a day (BID) | ORAL | 0 refills | Status: DC

## 2019-12-11 NOTE — Addendum Note (Signed)
Addended by: [name] on: 12/11/2019 04:40 PM   Modules accepted: Orders

## 2019-12-11 NOTE — Progress Notes (Signed)
Subjective: CC: Rash PCP: Mary Norlander, DO HDQ:QIWLN Mary [name] is a 54 y.o. female presenting to clinic today for:  1.  Rash Patient reports recurrent rash.  She was initially treated for this at the urgent care and symptoms did seem to improve.  However, about a week and a half ago symptoms recurred.  No known exposures.  Denies any new soaps, perfumes, shampoos.  No new foods.  Denies any pets or known exposure to chiggers or fleas.  She takes daily antihistamine.  2.  Type 2 diabetes with hyperlipidemia Patient reports compliance with her Metformin 500 mg twice daily, Ozempic, Lipitor.  Blood sugars have been running relatively good.  She does report that she often craves sugar and wants to know if she can reduce the Metformin as she seems to associate that with that particular medication.  No change in vision or sensory changes.  ROS: Per HPI  No Known Allergies Past Medical History:  Diagnosis Date  . Allergy    seasonal  . Arthritis   . Diabetes mellitus without complication ([place])   . GERD (gastroesophageal reflux disease)   . Mononucleosis   . Seasonal allergies   . Viral hepatitis 10/2016    Current Outpatient Medications:  .  atorvastatin (LIPITOR) 40 MG tablet, Take 1 tablet (40 mg total) by mouth at bedtime., Disp: 90 tablet, Rfl: 3 .  Blood Glucose Monitoring Suppl (BLOOD GLUCOSE MONITOR SYSTEM) w/Device KIT, 1 Device 2 (two) times daily by Does not apply route., Disp: 1 each, Rfl: 0 .  cetirizine (ZYRTEC) 10 MG tablet, Take 10 mg by mouth daily., Disp: , Rfl:  .  famotidine (PEPCID) 10 MG tablet, Take 10 mg by mouth daily., Disp: , Rfl:  .  glucose blood test strip, Use as instructed, Disp: 100 each, Rfl: 12 .  ibuprofen (ADVIL,MOTRIN) 200 MG tablet, Take 200 mg by mouth as needed for pain., Disp: , Rfl:  .  Lancets MISC, Use to test blood sugar twice daily., Disp: 100 each, Rfl: 3 .  metFORMIN (GLUCOPHAGE) 500 MG tablet, Take 1 tablet (500 mg total) by mouth  2 (two) times daily with a meal., Disp: 180 tablet, Rfl: 3 .  Semaglutide,0.25 or 0.5MG/DOS, (OZEMPIC, 0.25 OR 0.5 MG/DOSE,) 2 MG/1.5ML SOPN, Inject 0.5 mg into the skin every 7 (seven) days., Disp: 1.5 mL, Rfl: 12 .  venlafaxine XR (EFFEXOR-XR) 75 MG 24 hr capsule, Take 150 mg by mouth. , Disp: , Rfl:  Social History   Socioeconomic History  . Marital status: Married    Spouse name: Not on file  . Number of children: Not on file  . Years of education: Not on file  . Highest education level: Not on file  Occupational History  . Not on file  Tobacco Use  . Smoking status: Never Smoker  . Smokeless tobacco: Never Used  Vaping Use  . Vaping Use: Never used  Substance and Sexual Activity  . Alcohol use: Yes    Comment: occasional  . Drug use: No  . Sexual activity: Yes  Other Topics Concern  . Not on file  Social History Narrative  . Not on file   Social Determinants of Health   Financial Resource Strain:   . Difficulty of Paying Living Expenses: Not on file  Food Insecurity:   . Worried About Charity fundraiser in the Last Year: Not on file  . Ran Out of Food in the Last Year: Not on file  Transportation Needs:   .  Lack of Transportation (Medical): Not on file  . Lack of Transportation (Non-Medical): Not on file  Physical Activity:   . Days of Exercise per Week: Not on file  . Minutes of Exercise per Session: Not on file  Stress:   . Feeling of Stress : Not on file  Social Connections:   . Frequency of Communication with Friends and Family: Not on file  . Frequency of Social Gatherings with Friends and Family: Not on file  . Attends Religious Services: Not on file  . Active Member of Clubs or Organizations: Not on file  . Attends Archivist Meetings: Not on file  . Marital Status: Not on file  Intimate Partner Violence:   . Fear of Current or Ex-Partner: Not on file  . Emotionally Abused: Not on file  . Physically Abused: Not on file  . Sexually Abused:  Not on file   Family History  Problem Relation Age of Onset  . Cancer Mother 49       colon  . Diabetes Brother   . Heart disease Paternal Grandmother   . Breast cancer Sister     Objective: Office vital signs reviewed. BP 127/82   Pulse 78   Temp 97.7 F (36.5 C) (Temporal)   Ht 5' 3"  (1.6 m)   Wt 249 lb (112.9 kg)   BMI 44.11 kg/m   Physical Examination:  General: Awake, alert, well nourished, No acute distress HEENT: Normal; sclera white.  TMs intact bilaterally Cardio: regular rate and rhythm, S1S2 heard, no murmurs appreciated Pulm: clear to auscultation bilaterally, no wheezes, rhonchi or rales; normal work of breathing on room air Extremities: warm, well perfused, No edema, cyanosis or clubbing; +2 pulses bilaterally MSK: normal gait and station Skin: Several, minimally inflamed lesions noted along the upper extremities and lower extremities.  These appear to be consistent with insect bites versus folliculitis.  Assessment/ Plan: 54 y.o. female   Folliculitis - Plan: triamcinolone cream (KENALOG) 0.1 %, doxycycline (VIBRA-TABS) 100 MG tablet, hydrOXYzine (ATARAX/VISTARIL) 25 MG tablet  Type 2 diabetes mellitus without complication, without long-term current use of insulin (HCC) - Plan: Bayer DCA Hb A1c Waived, CMP14+EGFR, metFORMIN (GLUCOPHAGE) 500 MG tablet  Hyperlipidemia associated with type 2 diabetes mellitus ([place])  Will empirically treat for folliculitis.  Discussed possible differentials including chigger reaction.  Does not appear to be scabies.  Certainly not involving the interdigital spaces.  If persistent, may need to consider eval by dermatology.  She will follow-up as needed on this issue  Check A1c, CMP.  I reduced her Metformin to daily.  She will continue Ozempic at current dose.  She will follow-up as needed or in 3-6 months.  No orders of the defined types were placed in this encounter.  No orders of the defined types were placed in this  encounter.    Mary Norlander, DO [place] [phone]

## 2019-12-12 LAB — CMP14+EGFR
ALT: 17 IU/L (ref 0–32)
AST: 18 IU/L (ref 0–40)
Albumin/Globulin Ratio: 1.8 (ref 1.2–2.2)
Albumin: 4.3 g/dL (ref 3.8–4.9)
Alkaline Phosphatase: 95 IU/L (ref 44–121)
BUN/Creatinine Ratio: 21 (ref 9–23)
BUN: 16 mg/dL (ref 6–24)
Bilirubin Total: 0.4 mg/dL (ref 0.0–1.2)
CO2: 27 mmol/L (ref 20–29)
Calcium: 9.6 mg/dL (ref 8.7–10.2)
Chloride: 100 mmol/L (ref 96–106)
Creatinine, Ser: 0.75 mg/dL (ref 0.57–1.00)
GFR calc Af Amer: 105 mL/min/{1.73_m2} (ref >59)
GFR calc non Af Amer: 91 mL/min/{1.73_m2} (ref >59)
Globulin, Total: 2.4 g/dL (ref 1.5–4.5)
Glucose: 89 mg/dL (ref 65–99)
Potassium: 4.5 mmol/L (ref 3.5–5.2)
Sodium: 141 mmol/L (ref 134–144)
Total Protein: 6.7 g/dL (ref 6.0–8.5)

## 2019-12-12 LAB — SARS-COV-2 SEMI-QUANTITATIVE TOTAL ANTIBODY, SPIKE
SARS-CoV-2 Semi-Quant Total Ab: 1167 U/mL (ref <0.8)
SARS-CoV-2 Spike Ab Interp: POSITIVE

## 2019-12-31 ENCOUNTER — Ambulatory Visit: Payer: 59 | Admitting: Family Medicine

## 2020-03-11 ENCOUNTER — Encounter: Payer: Self-pay | Admitting: Family Medicine

## 2020-03-11 ENCOUNTER — Other Ambulatory Visit: Payer: Self-pay

## 2020-03-11 ENCOUNTER — Ambulatory Visit: Payer: 59 | Admitting: Family Medicine

## 2020-03-11 VITALS — BP 112/78 | HR 63 | Temp 97.0°F | Ht 63.0 in | Wt 252.0 lb

## 2020-03-11 DIAGNOSIS — E785 Hyperlipidemia, unspecified: Secondary | ICD-10-CM

## 2020-03-11 DIAGNOSIS — R5383 Other fatigue: Secondary | ICD-10-CM | POA: Diagnosis not present

## 2020-03-11 DIAGNOSIS — R0683 Snoring: Secondary | ICD-10-CM

## 2020-03-11 DIAGNOSIS — E1169 Type 2 diabetes mellitus with other specified complication: Secondary | ICD-10-CM | POA: Diagnosis not present

## 2020-03-11 DIAGNOSIS — Z23 Encounter for immunization: Secondary | ICD-10-CM

## 2020-03-11 DIAGNOSIS — H65192 Other acute nonsuppurative otitis media, left ear: Secondary | ICD-10-CM

## 2020-03-11 LAB — BAYER DCA HB A1C WAIVED: HB A1C (BAYER DCA - WAIVED): 6.4 % (ref <7.0)

## 2020-03-11 NOTE — Progress Notes (Signed)
Subjective: CC: DM PCP: Mary Norlander, DO RCB:ULAGT Mary [name] is a 54 y.o. female presenting to clinic today for:  1. Type 2 Diabetes with hyperlipidemia:  Metformin was reduced to once daily at last visit.  She was continued on Ozempic, Lipitor.  She continues to have some fatigue.  She does admit to snoring.  She is never been evaluated for obstructive sleep apnea.  No chest pain, shortness of breath, edema or sensory changes  Last eye exam: needs Last foot exam: needs Last A1c:  Lab Results  Component Value Date   HGBA1C 5.7 12/11/2019   Nephropathy screen indicated?: needs Last flu, zoster and/or pneumovax:  Immunization History  Administered Date(s) Administered   Influenza,inj,Quad PF,6+ Mos 12/04/2018   Influenza-Unspecified 12/07/2017   Tdap 08/30/2017    2.  Ear pain Patient reports intermittent left ear pain.  She was previously told by a nurse at work that she had fluid on her ears.  She takes Zyrtec every night but does not use any Flonase.  No drainage.  ROS: Per HPI  No Known Allergies Past Medical History:  Diagnosis Date   Allergy    seasonal   Arthritis    Diabetes mellitus without complication (HCC)    GERD (gastroesophageal reflux disease)    Mononucleosis    Seasonal allergies    Viral hepatitis 10/2016    Current Outpatient Medications:    atorvastatin (LIPITOR) 40 MG tablet, Take 1 tablet (40 mg total) by mouth at bedtime., Disp: 90 tablet, Rfl: 3   Blood Glucose Monitoring Suppl (BLOOD GLUCOSE MONITOR SYSTEM) w/Device KIT, 1 Device 2 (two) times daily by Does not apply route., Disp: 1 each, Rfl: 0   cetirizine (ZYRTEC) 10 MG tablet, Take 10 mg by mouth daily., Disp: , Rfl:    famotidine (PEPCID) 10 MG tablet, Take 10 mg by mouth daily., Disp: , Rfl:    glucose blood test strip, Use as instructed, Disp: 100 each, Rfl: 12   hydrOXYzine (ATARAX/VISTARIL) 25 MG tablet, Take 0.5-1 tablets (12.5-25 mg total) by mouth 3 (three)  times daily as needed for itching., Disp: 30 tablet, Rfl: 0   ibuprofen (ADVIL,MOTRIN) 200 MG tablet, Take 200 mg by mouth as needed for pain., Disp: , Rfl:    Lancets MISC, Use to test blood sugar twice daily., Disp: 100 each, Rfl: 3   metFORMIN (GLUCOPHAGE) 500 MG tablet, Take 1 tablet (500 mg total) by mouth daily., Disp: 90 tablet, Rfl: 3   Semaglutide,0.25 or 0.5MG/DOS, (OZEMPIC, 0.25 OR 0.5 MG/DOSE,) 2 MG/1.5ML SOPN, Inject 0.5 mg into the skin every 7 (seven) days., Disp: 1.5 mL, Rfl: 12   triamcinolone cream (KENALOG) 0.1 %, Apply 1 application topically 2 (two) times daily. x7-14 days, Disp: 60 g, Rfl: 0   venlafaxine XR (EFFEXOR-XR) 75 MG 24 hr capsule, Take 150 mg by mouth. , Disp: , Rfl:  Social History   Socioeconomic History   Marital status: Married    Spouse name: Not on file   Number of children: Not on file   Years of education: Not on file   Highest education level: Not on file  Occupational History   Not on file  Tobacco Use   Smoking status: Never Smoker   Smokeless tobacco: Never Used  Vaping Use   Vaping Use: Never used  Substance and Sexual Activity   Alcohol use: Yes    Comment: occasional   Drug use: No   Sexual activity: Yes  Other Topics Concern  Not on file  Social History Narrative   Not on file   Social Determinants of Health   Financial Resource Strain: Not on file  Food Insecurity: Not on file  Transportation Needs: Not on file  Physical Activity: Not on file  Stress: Not on file  Social Connections: Not on file  Intimate Partner Violence: Not on file   Family History  Problem Relation Age of Onset   Cancer Mother 36       colon   Diabetes Brother    Heart disease Paternal Grandmother    Breast cancer Sister     Objective: Office vital signs reviewed. BP 112/78    Pulse 63    Temp (!) 97 F (36.1 C) (Temporal)    Ht 5' 3"  (1.6 m)    Wt 252 lb (114.3 kg)    BMI 44.64 kg/m   Physical Examination:   General: Awake, alert, well appearing, No acute distress Ears: Left ear with clear effusion noted.  Right ear with moderate dry skin and hard cerumen in the external auditory canal.  TM unremarkable Neck: 18.75" Cardio: regular rate and rhythm, S1S2 heard, no murmurs appreciated Pulm: clear to auscultation bilaterally, no wheezes, rhonchi or rales; normal work of breathing on room air Extremities: warm, well perfused, No edema, cyanosis or clubbing; +2 pulses bilaterally MSK: norml gait and station Skin: dry; intact; no rashes or lesions Neuro: see DM foot  Diabetic Foot Exam - Simple   Simple Foot Form Diabetic Foot exam was performed with the following findings: Yes 03/11/2020  8:45 AM  Visual Inspection No deformities, no ulcerations, no other skin breakdown bilaterally: Yes Sensation Testing Intact to touch and monofilament testing bilaterally: Yes Pulse Check Posterior Tibialis and Dorsalis pulse intact bilaterally: Yes Comments    Assessment/ Plan: 54 y.o. female   Controlled type 2 diabetes mellitus with other specified complication, without long-term current use of insulin ([place]) - Plan: Bayer DCA Hb A1c Waived, Microalbumin / creatinine urine ratio  Hyperlipidemia associated with type 2 diabetes mellitus ([place]) - Plan: Lipid Panel  Morbid obesity ([place]) - Plan: Ambulatory referral to Sleep Studies  Fatigue, unspecified type - Plan: Ambulatory referral to Sleep Studies  Snoring - Plan: Ambulatory referral to Sleep Studies  Acute effusion of left ear  A1c has risen since last visit but is still within acceptable range.  Okay to continue Metformin and Ozempic at current dose.  Pneumococcal vaccination administered.  Foot exam performed.  Release of information form completed for eye exam from my eye doctor  Continue statin.  Check fasting lipid  I am quite concerned that she has an undiagnosed obstructive sleep apnea causing her excessive fatigue.  Her stop bang score  was 5.  Referral to sleep studies has been placed  Effusion of left ears likely secondary to poor eustachian tube drainage.  Recommend use of Flonase and gentle massage to the eustachian tube to promote drainage.  No evidence of secondary infection today  No orders of the defined types were placed in this encounter.  No orders of the defined types were placed in this encounter.    Mary Norlander, DO [place] [phone]

## 2020-03-11 NOTE — Addendum Note (Signed)
Addended byDory Peru on: 03/11/2020 11:32 AM   Modules accepted: Orders

## 2020-03-11 NOTE — Patient Instructions (Signed)
Sugar looks great.  You did have a rise after reducing Metformin but you are still controlled so no changes needed.  Referral to sleep medicine placed for sleep study   Sleep Apnea Sleep apnea is a condition in which breathing pauses or becomes shallow during sleep. Episodes of sleep apnea usually last 10 seconds or longer, and they may occur as many as 20 times an hour. Sleep apnea disrupts your sleep and keeps your body from getting the rest that it needs. This condition can increase your risk of certain health problems, including:  Heart attack.  Stroke.  Obesity.  Diabetes.  Heart failure.  Irregular heartbeat. What are the causes? There are three kinds of sleep apnea:  Obstructive sleep apnea. This kind is caused by a blocked or collapsed airway.  Central sleep apnea. This kind happens when the part of the brain that controls breathing does not send the correct signals to the muscles that control breathing.  Mixed sleep apnea. This is a combination of obstructive and central sleep apnea. The most common cause of this condition is a collapsed or blocked airway. An airway can collapse or become blocked if:  Your throat muscles are abnormally relaxed.  Your tongue and tonsils are larger than normal.  You are overweight.  Your airway is smaller than normal. What increases the risk? You are more likely to develop this condition if you:  Are overweight.  Smoke.  Have a smaller than normal airway.  Are elderly.  Are female.  Drink alcohol.  Take sedatives or tranquilizers.  Have a family history of sleep apnea. What are the signs or symptoms? Symptoms of this condition include:  Trouble staying asleep.  Daytime sleepiness and tiredness.  Irritability.  Loud snoring.  Morning headaches.  Trouble concentrating.  Forgetfulness.  Decreased interest in sex.  Unexplained sleepiness.  Mood swings.  Personality changes.  Feelings of  depression.  Waking up often during the night to urinate.  Dry mouth.  Sore throat. How is this diagnosed? This condition may be diagnosed with:  A medical history.  A physical exam.  A series of tests that are done while you are sleeping (sleep study). These tests are usually done in a sleep lab, but they may also be done at home. How is this treated? Treatment for this condition aims to restore normal breathing and to ease symptoms during sleep. It may involve managing health issues that can affect breathing, such as high blood pressure or obesity. Treatment may include:  Sleeping on your side.  Using a decongestant if you have nasal congestion.  Avoiding the use of depressants, including alcohol, sedatives, and narcotics.  Losing weight if you are overweight.  Making changes to your diet.  Quitting smoking.  Using a device to open your airway while you sleep, such as: ? An oral appliance. This is a custom-made mouthpiece that shifts your lower jaw forward. ? A continuous positive airway pressure (CPAP) device. This device blows air through a mask when you breathe out (exhale). ? A nasal expiratory positive airway pressure (EPAP) device. This device has valves that you put into each nostril. ? A bi-level positive airway pressure (BPAP) device. This device blows air through a mask when you breathe in (inhale) and breathe out (exhale).  Having surgery if other treatments do not work. During surgery, excess tissue is removed to create a wider airway. It is important to get treatment for sleep apnea. Without treatment, this condition can lead to:  High blood  pressure.  Coronary artery disease.  In men, an inability to achieve or maintain an erection (impotence).  Reduced thinking abilities. Follow these instructions at home: Lifestyle  Make any lifestyle changes that your health care provider recommends.  Eat a healthy, well-balanced diet.  Take steps to lose weight  if you are overweight.  Avoid using depressants, including alcohol, sedatives, and narcotics.  Do not use any products that contain nicotine or tobacco, such as cigarettes, e-cigarettes, and chewing tobacco. If you need help quitting, ask your health care provider. General instructions  Take over-the-counter and prescription medicines only as told by your health care provider.  If you were given a device to open your airway while you sleep, use it only as told by your health care provider.  If you are having surgery, make sure to tell your health care provider you have sleep apnea. You may need to bring your device with you.  Keep all follow-up visits as told by your health care provider. This is important. Contact a health care provider if:  The device that you received to open your airway during sleep is uncomfortable or does not seem to be working.  Your symptoms do not improve.  Your symptoms get worse. Get help right away if:  You develop: ? Chest pain. ? Shortness of breath. ? Discomfort in your back, arms, or stomach.  You have: ? Trouble speaking. ? Weakness on one side of your body. ? Drooping in your face. These symptoms may represent a serious problem that is an emergency. Do not wait to see if the symptoms will go away. Get medical help right away. Call your local emergency services (911 in the [place] Do not drive yourself to the hospital. Summary  Sleep apnea is a condition in which breathing pauses or becomes shallow during sleep.  The most common cause is a collapsed or blocked airway.  The goal of treatment is to restore normal breathing and to ease symptoms during sleep. This information is not intended to replace advice given to you by your health care provider. Make sure you discuss any questions you have with your health care provider. Document Revised: 08/16/2018 Document Reviewed: 10/25/2017 Elsevier Patient Education  [place].

## 2020-03-12 LAB — LIPID PANEL
Chol/HDL Ratio: 2.4 ratio (ref 0.0–4.4)
Cholesterol, Total: 140 mg/dL (ref 100–199)
HDL: 59 mg/dL (ref >39)
LDL Chol Calc (NIH): 61 mg/dL (ref 0–99)
Triglycerides: 109 mg/dL (ref 0–149)
VLDL Cholesterol Cal: 20 mg/dL (ref 5–40)

## 2020-03-12 LAB — MICROALBUMIN / CREATININE URINE RATIO
Creatinine, Urine: 142.7 mg/dL
Microalb/Creat Ratio: 7 mg/g creat (ref 0–29)
Microalbumin, Urine: 10.4 ug/mL

## 2020-03-27 ENCOUNTER — Other Ambulatory Visit: Payer: Self-pay | Admitting: Obstetrics and Gynecology

## 2020-03-27 DIAGNOSIS — Z1231 Encounter for screening mammogram for malignant neoplasm of breast: Secondary | ICD-10-CM

## 2020-04-16 ENCOUNTER — Institutional Professional Consult (permissible substitution): Payer: 59 | Admitting: Neurology

## 2020-04-23 ENCOUNTER — Other Ambulatory Visit: Payer: Self-pay | Admitting: Family Medicine

## 2020-04-23 DIAGNOSIS — E785 Hyperlipidemia, unspecified: Secondary | ICD-10-CM

## 2020-05-07 ENCOUNTER — Institutional Professional Consult (permissible substitution): Payer: 59 | Admitting: Neurology

## 2020-05-09 ENCOUNTER — Ambulatory Visit
Admission: RE | Admit: 2020-05-09 | Discharge: 2020-05-09 | Disposition: A | Discharge status: Home / Self Care | Payer: 59 | Source: Ambulatory Visit | Attending: Obstetrics and Gynecology | Admitting: Obstetrics and Gynecology

## 2020-05-09 ENCOUNTER — Other Ambulatory Visit: Payer: Self-pay

## 2020-05-09 DIAGNOSIS — Z1231 Encounter for screening mammogram for malignant neoplasm of breast: Secondary | ICD-10-CM

## 2020-05-23 ENCOUNTER — Encounter: Payer: Self-pay | Admitting: Gastroenterology

## 2020-05-28 ENCOUNTER — Ambulatory Visit (INDEPENDENT_AMBULATORY_CARE_PROVIDER_SITE_OTHER): Payer: 59 | Admitting: Neurology

## 2020-05-28 ENCOUNTER — Encounter: Payer: Self-pay | Admitting: Neurology

## 2020-05-28 VITALS — BP 141/85 | HR 69 | Ht 64.0 in | Wt 257.0 lb

## 2020-05-28 DIAGNOSIS — R0681 Apnea, not elsewhere classified: Secondary | ICD-10-CM

## 2020-05-28 DIAGNOSIS — G4710 Hypersomnia, unspecified: Secondary | ICD-10-CM

## 2020-05-28 DIAGNOSIS — R0683 Snoring: Secondary | ICD-10-CM | POA: Diagnosis not present

## 2020-05-28 DIAGNOSIS — K219 Gastro-esophageal reflux disease without esophagitis: Secondary | ICD-10-CM | POA: Diagnosis not present

## 2020-05-28 DIAGNOSIS — Z6841 Body Mass Index (BMI) 40.0 and over, adult: Secondary | ICD-10-CM

## 2020-05-28 NOTE — Patient Instructions (Signed)

## 2020-05-28 NOTE — Progress Notes (Signed)
SLEEP MEDICINE CLINIC    Provider:  Larey Seat, MD  Primary Care Physician:  [name], DO [place] [id number]     Referring Provider: Halford Chessman [place],  [place] [id number]          Chief Complaint according to patient   Patient presents with:    . New Patient (Initial Visit)           HISTORY OF PRESENT ILLNESS:  Mary [name] is a 55 y.o. Caucasian female patient and seen on 05/28/2020 - for a sleep consultation.  Chief concern according to patient :  Pt states that she is here to eval if sleep is a concern. Overall avg 6-7 hrs of sleep which is fragmented. Wakes up feeling tired and as the day goes on the fatigue worsens. She admits to snoring. She had weight gain.  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   Sleep relevant medical history: Nocturia , she wakes up and urinates, it is not the urge to wake her. No Tonsillectomy, no ENT surgery,   Family medical /sleep history: No other family member on CPAP with OSA, insomnia, sleep walkers. sister is a breast cancer survivor with chemotherapy induced neuropathy. .    Social history:  Patient is working as  Banker, desk job- and lives in a household with spouse and l daughter has moved out, her dog stayed..  The patient currently works in shifts( formerly rotating,) now early shift.   Tobacco use; none , had smoked some in 1990.Marland Kitchen  ETOH use : rarely,  Caffeine intake in form of Coffee( 4 cups) Soda(  1 a day) Tea ( /) or energy drinks. Regular exercise in form of  pickle ball.     Sleep habits are as follows: The patient's dinner time is between 6-7 PM. The patient goes to bed at 9-10 PM and she has indigestion, she usually continues to sleep in intervals of 3 hours, wakes and goes for bathroom breaks.GERD. bedroom is cool, dark and quiet- her daughter dog may interrupt her sleep.  The preferred sleep position is side ways, with the support of 2 pillows. Head elevated, Dreams are reportedly rare.  5  AM is the usual rise time. The patient wakes up spontaneously before alarm. She reports not feeling refreshed or restored in AM, with symptoms such as rare morning headaches and neck stiffness,  residual fatigue.  Naps are taken frequently, lasting from 30-90 minutes and are more refreshing than nocturnal sleep.    Review of Systems: Out of a complete 14 system review, the patient complains of only the following symptoms, and all other reviewed systems are negative.:  Fatigue, sleepiness , snoring, fragmented sleep, Insomnia.  Post viral fatigued- for month.    Hearing loss on the left   How likely are you to doze in the following situations: 0 = not likely, 1 = slight chance, 2 = moderate chance, 3 = high chance   Sitting and Reading? Watching Television? Sitting inactive in a public place (theater or meeting)? As a passenger in a car for an hour without  a break? Lying down in the afternoon when circumstances permit? Sitting and talking to someone? Sitting quietly after lunch without alcohol? In a car, while stopped for a few minutes in traffic?   Total = 11/ 24 points   FSS endorsed at 25/ 63 points.   Social History   Socioeconomic History  . Marital status: Married    Spouse name: Not on file  . Number of children: Not on file  . Years of education: Not on file  . Highest education level: Not on file  Occupational History  . Not on file  Tobacco Use  . Smoking status: Never Smoker  . Smokeless tobacco: Never Used  Vaping Use  .  Vaping Use: Never used  Substance and Sexual Activity  . Alcohol use: Yes    Comment: occasional  . Drug use: No  . Sexual activity: Yes  Other Topics Concern  . Not on file  Social History Narrative  . Not on file   Social Determinants of Health   Financial Resource Strain: Not on file  Food Insecurity: Not on file  Transportation Needs: Not on file  Physical Activity: Not on file  Stress: Not on file  Social Connections: Not on file    Family History  Problem Relation Age of Onset  . Cancer Mother 55       colon  . Diabetes Brother   . Heart disease Paternal Grandmother   . Breast cancer Sister     Past Medical History:  Diagnosis Date  . Allergy    seasonal  . Arthritis   . Diabetes mellitus without complication ([place])   . GERD (gastroesophageal reflux disease)   . Mononucleosis   . Seasonal allergies   . Viral hepatitis 10/2016    Past Surgical History:  Procedure Laterality Date  . CARPAL TUNNEL RELEASE Left 12/05/2012   Procedure: CARPAL TUNNEL RELEASE;  Surgeon: Sanjuana Kava, MD;  Location: [facility];  Service: Orthopedics;  Laterality: Left;  . TUBAL LIGATION  2004     Current Outpatient Medications on File Prior to Visit  Medication Sig Dispense Refill  . atorvastatin (LIPITOR) 40 MG tablet TAKE ONE TABLET DAILY AT BEDTIME 90 tablet 1  . Blood Glucose Monitoring Suppl (BLOOD GLUCOSE MONITOR SYSTEM) w/Device KIT 1 Device 2 (two) times daily by Does not apply route. 1 each 0  . famotidine (PEPCID) 10 MG tablet Take 10 mg by mouth daily.    . fexofenadine (ALLEGRA) 180 MG tablet Take 180 mg by mouth daily.    Marland Kitchen glucose blood test strip Use as instructed 100 each 12  . ibuprofen (ADVIL,MOTRIN) 200 MG tablet Take 200 mg by mouth as needed for pain.    . Lancets MISC Use to test blood sugar twice daily. 100 each 3  . metFORMIN (GLUCOPHAGE) 500 MG tablet Take 1 tablet (500 mg total) by mouth daily. 90 tablet 3  . Semaglutide,0.25 or 0.5MG/DOS, (OZEMPIC, 0.25  OR 0.5 MG/DOSE,) 2 MG/1.5ML SOPN Inject 0.5 mg into the skin every 7 (seven) days. 1.5 mL 12  . venlafaxine XR (EFFEXOR-XR) 75 MG 24 hr capsule Take 150 mg by mouth.      No current facility-administered medications on file prior to visit.   Physical exam:  Today's Vitals   05/28/20 0849  BP: (!) 141/85  Pulse: 69  Weight: 257 lb (116.6 kg)  Height: 5' 4"  (1.626 m)   Body mass index is 44.11 kg/m.   Wt Readings from Last 3 Encounters:  05/28/20 257 lb (116.6 kg)  03/11/20 252 lb (114.3 kg)  12/11/19 249 lb (112.9 kg)     Ht Readings from Last 3 Encounters:  05/28/20 5' 4"  (1.626 m)  03/11/20 5' 3"  (1.6 m)  12/11/19 5' 3"  (1.6 m)      General: The patient is awake, alert and appears not in acute distress. The patient is well groomed. Head: Normocephalic, atraumatic. Neck is supple. Mallampati 1- fully elevated ,  neck circumference: 17 inches . Nasal airflow  patent.  Retrognathia is not seen, but lower jaw is small, oral cavity small. .  Dental status: crowded.  Cardiovascular:  Regular rate and cardiac rhythm by pulse,  without distended neck veins. Respiratory: Lungs are clear to auscultation.  Skin:  Without evidence of ankle edema, or rash. Trunk: The patient's posture is erect.   Neurologic exam : The patient is awake and alert, oriented to place and time.   Memory subjective described as intact.  Attention span & concentration ability appears normal.  Speech is fluent, but affected by hearing -  without dysarthria, dysphonia or aphasia.  Mood and affect are appropriate.   Cranial nerves: no loss of smell or taste reported  Pupils are equal and briskly reactive to light. Funduscopic exam deferred.  Extraocular movements in vertical and horizontal planes were intact and without nystagmus. No Diplopia. Visual fields by finger perimetry are intact. Hearing loss, left ear deafness. .    Facial sensation intact to fine touch.  Facial motor strength is symmetric and  tongue and uvula move midline.  Neck ROM : rotation, tilt and flexion extension were normal for age and shoulder shrug was symmetrical.    Motor exam:  Symmetric bulk, tone and ROM.   Normal tone without cog wheeling, symmetric grip strength .   Sensory:  Fine touch and vibration were normal.  Proprioception tested in the upper extremities was normal.   Coordination: Rapid alternating movements in the fingers/hands were of normal speed.  The Finger-to-nose maneuver was intact without evidence of ataxia, dysmetria or tremor.   Gait and station: Patient could rise unassisted from a seated position, walked without assistive device.  Stance is of normal width/ base .Deep tendon reflexes: in the  upper and lower extremities are symmetric and intact.  Babinski response was deferred .       After spending a total time of 45 minutes face to face and additional time for physical and neurologic examination, review of laboratory studies,  personal review of imaging studies, reports and results of other testing and review of referral information / records as far as provided in visit, I have established the following assessments:  1) Mrs Beachem has several risk factors for the presence of sleep apnea -there is some nocturia also she feels that she has woken up for other reasons and then uses the arousal to go to the bathroom and it is not primarily the urge to urinate that wakes her.  There has been snoring described by her husband.  She has underlying conditions that can be affected such as elevated blood pressure and elevated blood glucose levels.  Her main risk factors are a BMI of 44, a small oral cavity and a large neck.  She averages certainly 6 hours of sleep or more but the quality of sleep is her concern as she is still fatigued and somewhat drowsy and daytime and if she finds an opportunity to nap she will use it.  We will also discuss to  put all caffeinated beverages to be consumed in the  morning and not within 6 hours of bedtime, she has described some indigestion and acid reflux which may actually lead to apnea-like events.  Sleep choking has been discribed.  And for this reason it is better if she sleeps with a slight elevation of the head of bed may be a wedge under the mattress, also she should have 3 hours between her meals and her bedtime to aid digestion.        My Plan is to proceed with:  1)At this time I would like to screen for sleep apnea and I would not need Mrs. Rahrig to have an attended sleep study I think a home sleep test will suffice.  I will also offer her some additional help for weight loss to reduce risk factors overall.  Consider healthy weight and wellness. Metabolic rate testing.     I would like to thank  Halford Chessman [place],  [place] [address] for allowing me to meet with and to take care of this pleasant patient.     I plan to follow up either personally or through our NP within 2-4  month.   CC: I will share my notes with PCP   Electronically signed by: Larey Seat, MD 05/28/2020 9:31 AM  [facility] [place] certified by The [employer] of Sleep Medicine and Diplomate of the [employer] of Sleep Medicine. Board certified In Neurology through the [place], Fellow of the [employer] of Neurology. Medical Director of [employer].

## 2020-06-05 ENCOUNTER — Telehealth: Payer: Self-pay

## 2020-06-05 NOTE — Telephone Encounter (Signed)
Calling to schedule home sleep test.

## 2020-06-11 ENCOUNTER — Telehealth: Payer: Self-pay

## 2020-06-11 NOTE — Telephone Encounter (Signed)
LVM for pt to call me back to schedule sleep study  

## 2020-06-11 NOTE — Telephone Encounter (Signed)
Please schedule an appointment for pt. This needs to be addressed in a visit. Thank you.

## 2020-06-13 ENCOUNTER — Encounter: Payer: Self-pay | Admitting: Gastroenterology

## 2020-06-13 NOTE — Telephone Encounter (Signed)
Appointment made

## 2020-06-18 ENCOUNTER — Encounter: Payer: Self-pay | Admitting: Family Medicine

## 2020-06-18 ENCOUNTER — Ambulatory Visit: Payer: 59 | Admitting: Family Medicine

## 2020-06-18 ENCOUNTER — Other Ambulatory Visit: Payer: Self-pay

## 2020-06-18 VITALS — BP 131/78 | HR 75 | Temp 97.7°F | Ht 64.0 in | Wt 260.2 lb

## 2020-06-18 DIAGNOSIS — R5383 Other fatigue: Secondary | ICD-10-CM

## 2020-06-18 DIAGNOSIS — E1169 Type 2 diabetes mellitus with other specified complication: Secondary | ICD-10-CM

## 2020-06-18 DIAGNOSIS — D649 Anemia, unspecified: Secondary | ICD-10-CM | POA: Diagnosis not present

## 2020-06-18 LAB — HEMOGLOBIN, FINGERSTICK: Hemoglobin: 11.7 g/dL (ref 11.1–15.9)

## 2020-06-18 NOTE — Patient Instructions (Signed)
Stop metformin. Monitor sugar.  Goal <150 each morning.  If above 150, call me and I will increase Ozempic

## 2020-06-18 NOTE — Progress Notes (Signed)
Subjective: CC:? Low hemoglobin PCP: [name], DO LOV:Mary [name] is a 55 y.o. female presenting to clinic today for:  1.  Anemia Patient notes that she went recently to donate blood and she was told that her hemoglobin was too low.  Her initial reading was somewhere around 10.7 with a secondary reading around 11.2.  Her typical hemoglobin level is somewhere around 13 but last check was in 2019.  She is not had problems with any GI bleeding that she knows of and maintains a relatively good diet.  She is treated with Metformin and occasionally does take Advil.  She reports some fatigue but otherwise has been asymptomatic   ROS: Per HPI  No Known Allergies Past Medical History:  Diagnosis Date  . Allergy    seasonal  . Arthritis   . Diabetes mellitus without complication ([place])   . GERD (gastroesophageal reflux disease)   . Mononucleosis   . Seasonal allergies   . Viral hepatitis 10/2016    Current Outpatient Medications:  .  atorvastatin (LIPITOR) 40 MG tablet, TAKE ONE TABLET DAILY AT BEDTIME, Disp: 90 tablet, Rfl: 1 .  Blood Glucose Monitoring Suppl (BLOOD GLUCOSE MONITOR SYSTEM) w/Device KIT, 1 Device 2 (two) times daily by Does not apply route., Disp: 1 each, Rfl: 0 .  famotidine (PEPCID) 10 MG tablet, Take 10 mg by mouth daily., Disp: , Rfl:  .  fexofenadine (ALLEGRA) 180 MG tablet, Take 180 mg by mouth daily., Disp: , Rfl:  .  glucose blood test strip, Use as instructed, Disp: 100 each, Rfl: 12 .  ibuprofen (ADVIL,MOTRIN) 200 MG tablet, Take 200 mg by mouth as needed for pain., Disp: , Rfl:  .  Lancets MISC, Use to test blood sugar twice daily., Disp: 100 each, Rfl: 3 .  metFORMIN (GLUCOPHAGE) 500 MG tablet, Take 1 tablet (500 mg total) by mouth daily., Disp: 90 tablet, Rfl: 3 .  Semaglutide,0.25 or 0.5MG/DOS, (OZEMPIC, 0.25 OR 0.5 MG/DOSE,) 2 MG/1.5ML SOPN, Inject 0.5 mg into the skin every 7 (seven) days., Disp: 1.5 mL, Rfl: 12 .  venlafaxine XR (EFFEXOR-XR)  75 MG 24 hr capsule, Take 150 mg by mouth. , Disp: , Rfl:  Social History   Socioeconomic History  . Marital status: Married    Spouse name: Not on file  . Number of children: Not on file  . Years of education: Not on file  . Highest education level: Not on file  Occupational History  . Not on file  Tobacco Use  . Smoking status: Never Smoker  . Smokeless tobacco: Never Used  Vaping Use  . Vaping Use: Never used  Substance and Sexual Activity  . Alcohol use: Yes    Comment: occasional  . Drug use: No  . Sexual activity: Yes  Other Topics Concern  . Not on file  Social History Narrative  . Not on file   Social Determinants of Health   Financial Resource Strain: Not on file  Food Insecurity: Not on file  Transportation Needs: Not on file  Physical Activity: Not on file  Stress: Not on file  Social Connections: Not on file  Intimate Partner Violence: Not on file   Family History  Problem Relation Age of Onset  . Cancer Mother 14       colon  . Diabetes Brother   . Heart disease Paternal Grandmother   . Breast cancer Sister     Objective: Office vital signs reviewed. BP 131/78   Pulse 75  Temp 97.7 F (36.5 C) (Temporal)   Ht _0  (1.626 m)   Wt 260 lb 3.2 oz (118 kg)   SpO2 95%   BMI 44.66 kg/m   Physical Examination:  General: Awake, alert, obese, No acute distress HEENT: Normal; no conjunctival pallor Cardio: regular rate and rhythm, S1S2 heard, no murmurs appreciated Pulm: clear to auscultation bilaterally, no wheezes, rhonchi or rales; normal work of breathing on room air Extremities: Brisk capillary refill  Assessment/ Plan: 55 y.o. female   Fatigue, unspecified type - Plan: Hemoglobin, fingerstick, Anemia Profile B, Fecal occult blood, imunochemical  Anemia, unspecified type - Plan: Anemia Profile B, Fecal occult blood, imunochemical  Controlled type 2 diabetes mellitus with other specified complication, without long-term current use of  insulin (HCC)  Given anemia, will look at the full anemia profile.  Given use of Metformin I do worry about a potential vitamin B12 deficiency in this patient, as Metformin has been linked to B12 deficiency.  No known GI bleeding but I have sent her home with a fecal occult blood test to return as soon as she is able.  We will plan pending results.  We discussed red flag signs and symptoms warranting further evaluation and she voiced good understanding of this.  Okay to discontinue Metformin.  She will monitor blood sugars closely and if fasting blood sugars persistently above 150 plan to advance Ozempic to 1 mg  No orders of the defined types were placed in this encounter.  No orders of the defined types were placed in this encounter.    [name], DO [place] [phone]

## 2020-06-19 LAB — ANEMIA PROFILE B
Basophils Absolute: 0 10*3/uL (ref 0.0–0.2)
Basos: 1 %
EOS (ABSOLUTE): 0.2 10*3/uL (ref 0.0–0.4)
Eos: 2 %
Ferritin: 8 ng/mL — ABNORMAL LOW (ref 15–150)
Folate: 6.2 ng/mL (ref >3.0)
Hematocrit: 34.9 % (ref 34.0–46.6)
Hemoglobin: 11.2 g/dL (ref 11.1–15.9)
Immature Grans (Abs): 0 10*3/uL (ref 0.0–0.1)
Immature Granulocytes: 0 %
Iron Saturation: 7 % — CL (ref 15–55)
Iron: 31 ug/dL (ref 27–159)
Lymphocytes Absolute: 2.4 10*3/uL (ref 0.7–3.1)
Lymphs: 29 %
MCH: 26 pg — ABNORMAL LOW (ref 26.6–33.0)
MCHC: 32.1 g/dL (ref 31.5–35.7)
MCV: 81 fL (ref 79–97)
Monocytes Absolute: 0.5 10*3/uL (ref 0.1–0.9)
Monocytes: 6 %
Neutrophils Absolute: 5.3 10*3/uL (ref 1.4–7.0)
Neutrophils: 62 %
Platelets: 324 10*3/uL (ref 150–450)
RBC: 4.3 x10E6/uL (ref 3.77–5.28)
RDW: 15 % (ref 11.7–15.4)
Retic Ct Pct: 1.5 % (ref 0.6–2.6)
Total Iron Binding Capacity: 442 ug/dL (ref 250–450)
UIBC: 411 ug/dL (ref 131–425)
Vitamin B-12: 608 pg/mL (ref 232–1245)
WBC: 8.4 10*3/uL (ref 3.4–10.8)

## 2020-06-26 ENCOUNTER — Other Ambulatory Visit: Payer: Self-pay | Admitting: Family Medicine

## 2020-06-26 DIAGNOSIS — E119 Type 2 diabetes mellitus without complications: Secondary | ICD-10-CM

## 2020-07-07 ENCOUNTER — Ambulatory Visit (INDEPENDENT_AMBULATORY_CARE_PROVIDER_SITE_OTHER): Payer: 59 | Admitting: Neurology

## 2020-07-07 DIAGNOSIS — G4733 Obstructive sleep apnea (adult) (pediatric): Secondary | ICD-10-CM

## 2020-07-07 DIAGNOSIS — R0683 Snoring: Secondary | ICD-10-CM

## 2020-07-07 DIAGNOSIS — K219 Gastro-esophageal reflux disease without esophagitis: Secondary | ICD-10-CM

## 2020-07-07 DIAGNOSIS — Z6841 Body Mass Index (BMI) 40.0 and over, adult: Secondary | ICD-10-CM

## 2020-07-07 DIAGNOSIS — G4710 Hypersomnia, unspecified: Secondary | ICD-10-CM

## 2020-07-08 NOTE — Progress Notes (Signed)
[facility] (Watch PAT)  STUDY DATE: 07/07/20  DOB: 1966/03/13  MRN: [id number]  ORDERING CLINICIAN: Larey Seat, MD   REFERRING CLINICIAN: [name], DO   CLINICAL INFORMATION/HISTORY: Mary [name] a 55 y.o. Caucasian female patientand was seenon 05/28/2020 - for a sleep consultation.  Chiefconcern : Pt states that she is here to eval if sleep is a concern. Overall avg 6-7 hrs of sleep which is fragmented. Wakes up feeling tired and as the day goes on the fatigue worsens. She admits to snoring. She had weight gain.  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  Epworth sleepiness score: 11/24.  BMI: 44.11 kg/m  Neck Circumference:  17 "  FINDINGS:   Total Record Time (hours, min): 8 h 01 min  Total Sleep Time (hours, min):  7 h 7 min  Percent REM (%):    25.84 %  Calculated pAHI (per hour): 44.5    REM pAHI: 45.3    NREM pAHI: 44.2 Supine AHI: N/A   Oxygen Saturation (%) Mean: 92  Minimum oxygen saturation (%):        74   O2 Saturation Range (%): 74-100  O2Saturation (minutes) <=88%: 42.7 min  Pulse Mean (bpm):    80  Pulse Range (43-120)   IMPRESSION: This HST indicated the presence of severe OSA (obstructive sleep apnea) , not REM sleep dependent.  There was significant hypoxemia during sleep, by duration and nadir.    RECOMMENDATION: a dental device or inspire therapy is not suitable for this apnea type - I recommend return for an in lab titration as positive airway pressure and oxygen may need to be applied.   Plan B, if UHC  will not permit [place] the Plan A, is auto CPAP 6-18 cm water ,3cm EPR and mask of choice, heated humidification.     INTERPRETING PHYSICIAN:  Larey Seat, MD   [facility] and [facility] Sleep Board certified by The [employer] of Sleep Medicine and Diplomate of the [employer] of Sleep Medicine. Board certified In Neurology through the [place], Fellow of the [employer] of Neurology. Medical Director of [employer].   Sleep Summary  Oxygen Saturation Statistics   Start Study Time: End Study Time: Total Recording Time:  8:54:16 PM 5:02:44 AM 8 h, 8 min  Total Sleep Time % REM of Sleep Time:  7 h, 7 min  25.8    Mean: 92 Minimum: 74 Maximum: 100  Mean of Desaturations Nadirs (%):   87  Oxygen Desatur. %:  4-9 10-20 >20 Total  Events Number Total   135  103 56.0 42.7  3 1.2  241 100.0  Oxygen Saturation: <90 <=88 <85 <80 <70  Duration (minutes): Sleep % 56.2 13.2 42.7 14.8 10.0 3.5 2.2 0.5 0.0 0.0     Respiratory Indices      Total Events REM NREM All Night  pRDI: pAHI 3%:  318  315 45.3 45.3 44.8 44.2 44.9 44.5  ODI 4%: pAHI 4%:  241 254 33.8 34.1 34.0 35.9       Pulse Rate Statistics during Sleep (BPM)      Mean:  Minimum: 43 Maximum: 120      

## 2020-07-11 DIAGNOSIS — R0683 Snoring: Secondary | ICD-10-CM | POA: Insufficient documentation

## 2020-07-11 DIAGNOSIS — G4710 Hypersomnia, unspecified: Secondary | ICD-10-CM | POA: Insufficient documentation

## 2020-07-11 DIAGNOSIS — K219 Gastro-esophageal reflux disease without esophagitis: Secondary | ICD-10-CM | POA: Insufficient documentation

## 2020-07-11 HISTORY — DX: Hypersomnia, unspecified: G47.10

## 2020-07-11 NOTE — Progress Notes (Signed)
IMPRESSION: Calculated pAHI (per hour): 44.5. REM pAHI: 45.3 . Minimum oxygen saturation (%): 74          This HST indicated the presence of severe OSA (obstructive sleep apnea) , not REM sleep dependent.  There was significant hypoxemia during sleep, by duration and nadir.    RECOMMENDATION: a dental device or inspire therapy is not suitable for this apnea type - I recommend return for an in lab titration as positive airway pressure and oxygen may need to be applied.   Plan B, if UHC will not permit [place] the Plan A, is auto CPAP 6-18 cm water ,3cm EPR and mask of choice, heated humidification.

## 2020-07-11 NOTE — Addendum Note (Signed)
Addended by: Larey Seat on: 07/11/2020 10:23 AM   Modules accepted: Orders

## 2020-07-11 NOTE — Procedures (Signed)
[facility] (Watch PAT)  STUDY DATE: 07/07/20  DOB: 11-07-1965  MRN: [id number]  ORDERING CLINICIAN: Larey Seat, MD   REFERRING CLINICIAN: [name], DO   CLINICAL INFORMATION/HISTORY: Mary Wiggs Robertsis a 55 y.o. Caucasian female patientand was seenon 05/28/2020 - for a sleep consultation.  Chiefconcern : Pt states that she is here to eval if sleep is a concern. Overall avg 6-7 hrs of sleep which is fragmented. Wakes up feeling tired and as the day goes on the fatigue worsens. She admits to snoring. She had weight gain.  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  Epworth sleepiness score: 11/24.  BMI: 44.11 kg/m  Neck Circumference:  17 "  FINDINGS:   Total Record Time (hours, min): 8 h 01 min  Total Sleep Time (hours, min):  7 h 7 min  Percent REM (%):    25.84 %  Calculated pAHI (per hour): 44.5    REM pAHI: 45.3    NREM pAHI: 44.2 Supine AHI: N/A   Oxygen Saturation (%) Mean: 92  Minimum oxygen saturation (%):        74   O2 Saturation Range (%): 74-100  O2Saturation (minutes) <=88%: 42.7 min  Pulse Mean (bpm):    80  Pulse Range (43-120)   IMPRESSION: This HST indicated the presence of severe OSA (obstructive sleep apnea) , not REM sleep dependent.  There was significant hypoxemia during sleep, by duration and nadir.    RECOMMENDATION: a dental device or inspire therapy is not suitable for this apnea type - I recommend return for an in lab titration as positive airway pressure and oxygen may need to be applied.   Plan B, if UHC will  not permit [place] the Plan A, is auto CPAP 6-18 cm water ,3cm EPR and mask of choice, heated humidification.     INTERPRETING PHYSICIAN:  Larey Seat, MD   [facility] and [facility] Sleep Board certified by The [employer] of Sleep Medicine and Diplomate of the [employer] of Sleep Medicine. Board certified In Neurology through the [place], Fellow of the [employer] of Neurology. Medical Director of [employer].   Sleep Summary  Oxygen Saturation Statistics   Start Study Time: End Study Time: Total Recording Time:  8:54:16 PM 5:02:44 AM 8 h, 8 min  Total Sleep Time % REM of Sleep Time:  7 h, 7 min  25.8    Mean: 92 Minimum: 74 Maximum: 100  Mean of Desaturations Nadirs (%):   87  Oxygen Desatur. %:  4-9 10-20 >20 Total  Events Number Total   135  103 56.0 42.7  3 1.2  241 100.0  Oxygen Saturation: <90 <=88 <85 <80 <70  Duration (minutes): Sleep % 56.2 13.2 42.7 14.8 10.0 3.5 2.2 0.5 0.0 0.0     Respiratory Indices      Total Events REM NREM All Night  pRDI: pAHI 3%:  318  315 45.3 45.3 44.8 44.2 44.9 44.5  ODI 4%: pAHI 4%:  241 254 33.8 34.1 34.0 35.9       Pulse Rate Statistics during Sleep (BPM)      Mean: 80  Minimum: 43 Maximum: 120

## 2020-07-14 ENCOUNTER — Telehealth: Payer: Self-pay | Admitting: Neurology

## 2020-07-14 NOTE — Telephone Encounter (Signed)
-----   Message from Larey Seat, MD sent at 07/11/2020 10:23 AM EDT ----- IMPRESSION: Calculated pAHI (per hour): 44.5. REM pAHI: 45.3 . Minimum oxygen saturation (%): 74          This HST indicated the presence of severe OSA (obstructive sleep apnea) , not REM sleep dependent.  There was significant hypoxemia during sleep, by duration and nadir.    RECOMMENDATION: a dental device or inspire therapy is not suitable for this apnea type - I recommend return for an in lab titration as positive airway pressure and oxygen may need to be applied.   Plan B, if UHC will not permit [place] the Plan A, is auto CPAP 6-18 cm water ,3cm EPR and mask of choice, heated humidification.

## 2020-07-14 NOTE — Telephone Encounter (Signed)
I called pt. I advised pt that Dr. Brett Fairy reviewed their sleep study results and found that pt has severe sleep apnea. Dr. Brett Fairy recommends that pt starts auto CPAP. I reviewed PAP compliance expectations with the pt. Pt is agreeable to starting a CPAP. I advised pt that an order will be sent to a DME, [facility] ([facility] and [facility] ([facility]) will call the pt within about one week after they file with the pt's insurance. [facility]) will show the pt how to use the machine, fit for masks, and troubleshoot the CPAP if needed. A follow up appt was made for insurance purposes with Ward Givens, NP on Sept 7,2022 at 2:30 pm. Pt verbalized understanding to arrive 15 minutes early and bring their CPAP. A letter with all of this information in it will be mailed to the pt as a reminder. I verified with the pt that the address we have on file is correct. Pt verbalized understanding of results. Pt had no questions at this time but was encouraged to call back if questions arise. I have sent the order to [facility]) and have received confirmation that they have received the order.

## 2020-07-15 ENCOUNTER — Other Ambulatory Visit: Payer: Self-pay | Admitting: Neurology

## 2020-07-15 DIAGNOSIS — R0683 Snoring: Secondary | ICD-10-CM

## 2020-07-15 DIAGNOSIS — G4733 Obstructive sleep apnea (adult) (pediatric): Secondary | ICD-10-CM

## 2020-07-15 DIAGNOSIS — G4710 Hypersomnia, unspecified: Secondary | ICD-10-CM

## 2020-08-04 ENCOUNTER — Other Ambulatory Visit: Payer: Self-pay

## 2020-08-04 ENCOUNTER — Other Ambulatory Visit: Payer: 59

## 2020-08-04 DIAGNOSIS — Z1211 Encounter for screening for malignant neoplasm of colon: Secondary | ICD-10-CM

## 2020-08-06 LAB — FECAL OCCULT BLOOD, IMMUNOCHEMICAL: Fecal Occult Bld: NEGATIVE

## 2020-08-13 ENCOUNTER — Ambulatory Visit (AMBULATORY_SURGERY_CENTER): Payer: Self-pay

## 2020-08-13 ENCOUNTER — Other Ambulatory Visit: Payer: Self-pay

## 2020-08-13 VITALS — Ht 64.0 in | Wt 264.0 lb

## 2020-08-13 DIAGNOSIS — Z86010 Personal history of colonic polyps: Secondary | ICD-10-CM

## 2020-08-13 MED ORDER — NA SULFATE-K SULFATE-MG SULF 17.5-3.13-1.6 GM/177ML PO SOLN: 1.0000 | ORAL | 0 refills | Status: DC

## 2020-08-13 NOTE — Progress Notes (Signed)
Patient is here in-person for PV. Patient denies any allergies to eggs or soy. Patient denies any problems with anesthesia/sedation. Patient denies any oxygen use at home. Patient denies taking any diet/weight loss medications or blood thinners. Patient is not being treated for MRSA or C-diff. Patient is aware of our care-partner policy and FXOVA-91 safety protocol. EMMI education assigned to the patient for the procedure, sent to [place].   Patient is COVID-19 vaccinated, per pat.  Pt is taking IRON 325 mg daily.  She was instructed to hold IRON 5 days prior to procedure.  Pt states she understands.

## 2020-08-26 ENCOUNTER — Encounter: Payer: Self-pay | Admitting: Gastroenterology

## 2020-08-27 ENCOUNTER — Other Ambulatory Visit: Payer: Self-pay

## 2020-08-27 ENCOUNTER — Encounter: Payer: Self-pay | Admitting: Gastroenterology

## 2020-08-27 ENCOUNTER — Ambulatory Visit (AMBULATORY_SURGERY_CENTER): Payer: 59 | Admitting: Gastroenterology

## 2020-08-27 VITALS — BP 119/63 | HR 85 | Temp 98.6°F | Resp 14 | Ht 64.0 in

## 2020-08-27 DIAGNOSIS — Z86010 Personal history of colonic polyps: Secondary | ICD-10-CM

## 2020-08-27 MED ORDER — SODIUM CHLORIDE 0.9 % IV SOLN
500.0000 mL | Freq: Once | INTRAVENOUS | Status: DC
Start: 2020-08-27 — End: 2020-08-27

## 2020-08-27 NOTE — Op Note (Signed)
[facility] Patient Name: Mary [name] Procedure Date: 08/27/2020 1:29 PM MRN: [id number] Endoscopist: Mary [name] , MD Age: 55 Referring MD:  Date of Birth: 13-Sep-1965 Gender: Female Account #: [id number] Procedure:                Colonoscopy Indications:              High risk colon cancer surveillance: Personal                            history of colonic polyps - 3 adenomas / sessile                            serrated polyps removed 01/2017 Medicines:                Monitored Anesthesia Care Procedure:                Pre-Anesthesia Assessment:                           - Prior to the procedure, a History and Physical                            was performed, and patient medications and                            allergies were reviewed. The patient's tolerance of                            previous anesthesia was also reviewed. The risks                            and benefits of the procedure and the sedation                            options and risks were discussed with the patient.                            All questions were answered, and informed consent                            was obtained. Prior Anticoagulants: The patient has                            taken no previous anticoagulant or antiplatelet                            agents. ASA Grade Assessment: III - A patient with                            severe systemic disease. After reviewing the risks                            and benefits, the patient was deemed in  satisfactory condition to undergo the procedure.                           After obtaining informed consent, the colonoscope                            was passed under direct vision. Throughout the                            procedure, the patient's blood pressure, pulse, and                            oxygen saturations were monitored continuously. The                            Olympus PCF-[id number]  (#[id number]) Colonoscope was                            introduced through the anus and advanced to the the                            cecum, identified by appendiceal orifice and                            ileocecal valve. The colonoscopy was performed                            without difficulty. The patient tolerated the                            procedure well. The quality of the bowel                            preparation was good. The ileocecal valve,                            appendiceal orifice, and rectum were photographed. Scope In: 1:34:49 PM Scope Out: 1:51:06 PM Scope Withdrawal Time: 0 hours 13 minutes 31 seconds  Total Procedure Duration: 0 hours 16 minutes 17 seconds  Findings:                 The perianal and digital rectal examinations were                            normal.                           Scattered medium-mouthed diverticula were found in                            the left colon and right colon.                           Internal hemorrhoids were found during  retroflexion. The hemorrhoids were small.                           The exam was otherwise without abnormality. Complications:            No immediate complications. Estimated blood loss:                            None. Estimated Blood Loss:     Estimated blood loss: none. Impression:               - Diverticulosis in the left colon and in the right                            colon.                           - Internal hemorrhoids.                           - The examination was otherwise normal.                           - No polyps. Recommendation:           - Patient has a contact number available for                            emergencies. The signs and symptoms of potential                            delayed complications were discussed with the                            patient. Return to normal activities tomorrow.                            Written discharge  instructions were provided to the                            patient.                           - Resume previous diet.                           - Continue present medications.                           - Repeat colonoscopy in 5 years for surveillance                            purposes given history of polyps on the last exam Mary Duddy P. Dyamon Sosinski, MD 08/27/2020 1:57:53 PM This report has been signed electronically.

## 2020-08-27 NOTE — Patient Instructions (Signed)
Information on hemorrhoids and diverticulosis given to you today.  Resume previous diet and medications.  YOU HAD AN ENDOSCOPIC PROCEDURE TODAY AT [facility]:   Refer to the procedure report that was given to you for any specific questions about what was found during the examination.  If the procedure report does not answer your questions, please call your gastroenterologist to clarify.  If you requested that your care partner not be given the details of your procedure findings, then the procedure report has been included in a sealed envelope for you to review at your convenience later.  YOU SHOULD EXPECT: Some feelings of bloating in the abdomen. Passage of more gas than usual.  Walking can help get rid of the air that was put into your GI tract during the procedure and reduce the bloating. If you had a lower endoscopy (such as a colonoscopy or flexible sigmoidoscopy) you may notice spotting of blood in your stool or on the toilet paper. If you underwent a bowel prep for your procedure, you may not have a normal bowel movement for a few days.  Please Note:  You might notice some irritation and congestion in your nose or some drainage.  This is from the oxygen used during your procedure.  There is no need for concern and it should clear up in a day or so.  SYMPTOMS TO REPORT IMMEDIATELY:  Following lower endoscopy (colonoscopy or flexible sigmoidoscopy):  Excessive amounts of blood in the stool  Significant tenderness or worsening of abdominal pains  Swelling of the abdomen that is new, acute  Fever of 100F or higher  For urgent or emergent issues, a gastroenterologist can be reached at any hour by calling [phone]. Do not use MyChart messaging for urgent concerns.    DIET:  We do recommend a small meal at first, but then you may proceed to your regular diet.  Drink plenty of fluids but you should avoid alcoholic beverages for 24 hours.  ACTIVITY:  You should plan to  take it easy for the rest of today and you should NOT DRIVE or use heavy machinery until tomorrow (because of the sedation medicines used during the test).    FOLLOW UP: Our staff will call the number listed on your records 48-72 hours following your procedure to check on you and address any questions or concerns that you may have regarding the information given to you following your procedure. If we do not reach you, we will leave a message.  We will attempt to reach you two times.  During this call, we will ask if you have developed any symptoms of COVID 19. If you develop any symptoms (ie: fever, flu-like symptoms, shortness of breath, cough etc.) before then, please call [phone].  If you test positive for Covid 19 in the 2 weeks post procedure, please call and report this information to [place].    If any biopsies were taken you will be contacted by phone or by letter within the next 1-3 weeks.  Please call us at [phone] if you have not heard about the biopsies in 3 weeks.    SIGNATURES/CONFIDENTIALITY: You and/or your care partner have signed paperwork which will be entered into your electronic medical record.  These signatures attest to the fact that that the information above on your After Visit Summary has been reviewed and is understood.  Full responsibility of the confidentiality of this discharge information lies with you and/or your care-partner.

## 2020-08-27 NOTE — Progress Notes (Signed)
Pt's states no medical or surgical changes since previsit or office visit.   Cw vitals and Sm IV.

## 2020-08-27 NOTE — Progress Notes (Signed)
A/ox3, pleased with MAC, report to RN 

## 2020-08-29 ENCOUNTER — Telehealth: Payer: Self-pay

## 2020-08-29 NOTE — Telephone Encounter (Signed)
  Follow up Call-  Call back number 08/27/2020  Post procedure Call Back phone  # [phone]  Permission to leave phone message Yes  Some recent data might be hidden     Patient questions:  Do you have a fever, pain , or abdominal swelling? No. Pain Score  0 *  Have you tolerated food without any problems? Yes.    Have you been able to return to your normal activities? Yes.    Do you have any questions about your discharge instructions: Diet   No. Medications  No. Follow up visit  No.  Do you have questions or concerns about your Care? No.  Actions: * If pain score is 4 or above: No action needed, pain <4.  Have you developed a fever since your procedure? no  2.   Have you had an respiratory symptoms (SOB or cough) since your procedure? no  3.   Have you tested positive for COVID 19 since your procedure no  4.   Have you had any family members/close contacts diagnosed with the COVID 19 since your procedure?  no   If yes to any of these questions please route to Joylene John, RN and Joella Prince, RN

## 2020-09-09 ENCOUNTER — Encounter: Payer: Self-pay | Admitting: Family Medicine

## 2020-09-09 ENCOUNTER — Other Ambulatory Visit: Payer: Self-pay

## 2020-09-09 ENCOUNTER — Ambulatory Visit: Payer: 59 | Admitting: Family Medicine

## 2020-09-09 VITALS — BP 107/64 | HR 67 | Temp 97.6°F | Ht 64.0 in | Wt 265.8 lb

## 2020-09-09 DIAGNOSIS — G4733 Obstructive sleep apnea (adult) (pediatric): Secondary | ICD-10-CM | POA: Diagnosis not present

## 2020-09-09 DIAGNOSIS — E785 Hyperlipidemia, unspecified: Secondary | ICD-10-CM | POA: Diagnosis not present

## 2020-09-09 DIAGNOSIS — Z9989 Dependence on other enabling machines and devices: Secondary | ICD-10-CM | POA: Diagnosis not present

## 2020-09-09 DIAGNOSIS — E1169 Type 2 diabetes mellitus with other specified complication: Secondary | ICD-10-CM

## 2020-09-09 LAB — BAYER DCA HB A1C WAIVED: HB A1C (BAYER DCA - WAIVED): 6 % (ref <7.0)

## 2020-09-09 NOTE — Progress Notes (Signed)
Subjective: CC: DM PCP: [name], DO TLX:BWIOM Mary [name] is a 56 y.o. female presenting to clinic today for:  1. Type 2 Diabetes with hyperlipidemia:  Compliant with Ozempic 0.5 mg every 7 days.  No reports of GI symptoms.  No chest pain, shortness of breath.  Compliant with Lipitor.  Last eye exam: Up-to-date Last foot exam: Up-to-date Last A1c:  Lab Results  Component Value Date   HGBA1C 6.4 03/11/2020   Nephropathy screen indicated?:  Up-to-date Last flu, zoster and/or pneumovax:  Immunization History  Administered Date(s) Administered   Influenza,inj,Quad PF,6+ Mos 12/04/2018   Influenza-Unspecified 12/07/2017   Moderna Sars-Covid-2 Vaccination 05/30/2019, 07/05/2019, 01/25/2020   Pneumococcal Polysaccharide-23 03/11/2020   Tdap 08/30/2017   2.  OSA Patient recently diagnosed with sleep apnea.  She is currently treated with CPAP through [facility].  She has noticed improvement in energy.  Has follow-up with sleep medicine again in about 6 weeks.  Having some issues with the nose piece of the mask and will be seeing her supplier soon for this.  Getting at least 6 to 8 hours of sleep per night.  ROS: Per HPI  No Known Allergies Past Medical History:  Diagnosis Date   Allergy    seasonal   Anemia    Anxiety    Arthritis    Diabetes mellitus without complication (HCC)    GERD (gastroesophageal reflux disease)    Mononucleosis    Seasonal allergies    Sleep apnea    has not gotten c-pap yet   Viral hepatitis 10/2016    Current Outpatient Medications:    atorvastatin (LIPITOR) 40 MG tablet, TAKE ONE TABLET DAILY AT BEDTIME, Disp: 90 tablet, Rfl: 1   Blood Glucose Monitoring Suppl (BLOOD GLUCOSE MONITOR SYSTEM) w/Device KIT, 1 Device 2 (two) times daily by Does not apply route., Disp: 1 each, Rfl: 0   famotidine (PEPCID) 10 MG tablet, Take 10 mg by mouth daily., Disp: , Rfl:    ferrous sulfate 325 (65 FE) MG tablet, Take 325 mg by mouth 2 (two) times  daily with a meal., Disp: , Rfl:    fexofenadine (ALLEGRA) 180 MG tablet, Take 180 mg by mouth daily., Disp: , Rfl:    glucose blood test strip, Use as instructed, Disp: 100 each, Rfl: 12   ibuprofen (ADVIL,MOTRIN) 200 MG tablet, Take 200 mg by mouth as needed for pain., Disp: , Rfl:    Lancets MISC, Use to test blood sugar twice daily., Disp: 100 each, Rfl: 3   medroxyPROGESTERone (PROVERA) 10 MG tablet, medroxyprogesterone 10 mg tablet, Disp: , Rfl:    Misc Natural Products (OSTEO BI-FLEX/5-LOXIN ADVANCED PO), Osteo Bi-Flex (5-Loxin), Disp: , Rfl:    OZEMPIC, 0.25 OR 0.5 MG/DOSE, 2 MG/1.5ML SOPN, INJECT 0.5MG INTO THE SKIN EVERY 7 DAYS, Disp: 1.5 mL, Rfl: 2   venlafaxine XR (EFFEXOR-XR) 75 MG 24 hr capsule, Take 150 mg by mouth. , Disp: , Rfl:    vitamin C (ASCORBIC ACID) 500 MG tablet, Take 500 mg by mouth daily., Disp: , Rfl:  Social History   Socioeconomic History   Marital status: Married    Spouse name: Not on file   Number of children: Not on file   Years of education: Not on file   Highest education level: Not on file  Occupational History   Not on file  Tobacco Use   Smoking status: Never   Smokeless tobacco: Never  Vaping Use   Vaping Use: Never used  Substance  and Sexual Activity   Alcohol use: Yes    Comment: occasional   Drug use: No   Sexual activity: Yes  Other Topics Concern   Not on file  Social History Narrative   Not on file   Social Determinants of Health   Financial Resource Strain: Not on file  Food Insecurity: Not on file  Transportation Needs: Not on file  Physical Activity: Not on file  Stress: Not on file  Social Connections: Not on file  Intimate Partner Violence: Not on file   Family History  Problem Relation Age of Onset   Cancer Mother 91       colon   Diabetes Brother    Heart disease Paternal Grandmother    Breast cancer Sister    Colon polyps Sister    Colon cancer Neg Hx    Esophageal cancer Neg Hx    Rectal cancer Neg Hx     Stomach cancer Neg Hx     Objective: Office vital signs reviewed. BP 107/64   Pulse 67   Temp 97.6 F (36.4 C)   Ht 5' 4"  (1.626 m)   Wt 265 lb 12.8 oz (120.6 kg)   SpO2 95%   BMI 45.62 kg/m   Physical Examination:  General: Awake, alert, morbidly obese, No acute distress HEENT: Normal, sclera white, MMM Cardio: regular rate and rhythm, S1S2 heard, no murmurs appreciated Pulm: clear to auscultation bilaterally, no wheezes, rhonchi or rales; normal work of breathing on room air Extremities: warm, well perfused, No edema, cyanosis or clubbing; +2 pulses bilaterally  Assessment/ Plan: 55 y.o. female   Controlled type 2 diabetes mellitus with other specified complication, without long-term current use of insulin (HCC) - Plan: Bayer DCA Hb A1c Waived  Hyperlipidemia associated with type 2 diabetes mellitus (HCC)  OSA on CPAP  Seemingly doing well on Ozempic.  We discussed consideration for Mounjaro instead versus increasing dose of Ozempic to maximize weight loss.  She seems very interested in the former.  She will follow-up with Almyra Free for sample and instruction on how to use and transition from [place].  Plan to see each other back in 3 to 6 months for weight check.  In the meantime, continue statin  Doing well on CPAP thus far.  Possible change of mask soon.  Using  [facility]  No orders of the defined types were placed in this encounter.  No orders of the defined types were placed in this encounter.    [name], DO [place] [phone]

## 2020-09-12 ENCOUNTER — Ambulatory Visit: Payer: 59 | Admitting: Pharmacist

## 2020-09-12 ENCOUNTER — Other Ambulatory Visit: Payer: Self-pay

## 2020-09-12 VITALS — Wt 260.0 lb

## 2020-09-12 DIAGNOSIS — E1169 Type 2 diabetes mellitus with other specified complication: Secondary | ICD-10-CM

## 2020-09-12 MED ORDER — TIRZEPATIDE 2.5 MG/0.5ML ~~LOC~~ SOAJ: 2.5000 mg | SUBCUTANEOUS | 0 refills | Status: DC

## 2020-09-12 NOTE — Progress Notes (Signed)
    09/12/2020 Name: AZAYLAH STAILEY MRN: [id number] DOB: 17-Aug-1965   S:  55 yoF Presents for diabetes evaluation, education, and management Patient was referred and last seen by Primary Care Provider on 09/09/20.  Insurance coverage/medication affordability: [employer]ERCIAL   Patient reports adherence with medications. Current diabetes medications include: OZEMPIC Current hypertension medications include: N/A Goal 130/80 Current hyperlipidemia medications include: ATORVASTATIN  Patient denies hypoglycemic events.   Discussed meal planning options and Plate method for healthy eating Avoid sugary drinks and desserts Incorporate balanced protein, non starchy veggies, 1 serving of carbohydrate with each meal Increase water intake Increase physical activity as able  Patient-reported exercise habits: N/A  O:  Lab Results  Component Value Date   HGBA1C 6.0 09/09/2020   Lipid Panel     Component Value Date/Time   CHOL 140 03/11/2020 0843   TRIG 109 03/11/2020 0843   HDL 59 03/11/2020 0843   CHOLHDL 2.4 03/11/2020 0843   LDLCALC 61 03/11/2020 0843    Home fasting blood sugars: <130  2 hour post-meal/random blood sugars: <180.    Clinical Atherosclerotic Cardiovascular Disease (ASCVD): No   The 10-year ASCVD risk score Mikey Bussing DC Jr., et al., 2013) is: 1.5%   Values used to calculate the score:     Age: 55 years     Sex: Female     Is Non-Hispanic African American: No     Diabetic: Yes     Tobacco smoker: No     Systolic Blood Pressure: 382 mmHg     Is BP treated: No     HDL Cholesterol: 59 mg/dL     Total Cholesterol: 140 mg/dL    A/P:  Diabetes T2DM currently CONTROLLED, HOWEVER PATIENT IS LOOKING FOR ADDITIONAL WEIGHT LOSS.  WILL TRANSITION PATIENT TO MOUNJARO ($25 COPAY CARD REGARDLESS OF INSURANCE STATUS).   Denies personal and family history of Medullary thyroid cancer (MTC) -Stopped metformin 2020 -STOP Ozempic -START 2.5mg  mounajro sq weekly --> will  increase to 5mg  sq weekly in 4 weeks.  Will titrate as able  -Extensively discussed pathophysiology of diabetes, recommended lifestyle interventions, dietary effects on blood sugar control  -Counseled on s/sx of and management of hypoglycemia  Written patient instructions provided.  Total time in face to face counseling 25 minutes.   Follow up Pharmacist in 4 weeks for titration  Regina Eck, PharmD, BCPS Clinical Pharmacist, [place]  II Phone [phone]

## 2020-09-16 ENCOUNTER — Telehealth: Payer: Self-pay | Admitting: Pharmacist

## 2020-09-17 NOTE — Telephone Encounter (Signed)
Left patient Mary [name] denied on insurance but patient will still be able to get on copay card for 1 year We can re-address at that time

## 2020-10-02 ENCOUNTER — Telehealth: Payer: Self-pay

## 2020-10-02 NOTE — Telephone Encounter (Signed)
We received a call from this patient wanting to check and see if her initial CPAP appointment was within the 90 day period. Can you please reach out and advise her if her current appointment is okay to keep?  Thanks!

## 2020-10-03 ENCOUNTER — Telehealth: Payer: Self-pay | Admitting: Family Medicine

## 2020-10-03 NOTE — Telephone Encounter (Signed)
Patient has appt with Almyra Free on 8/16, but will need her [facility] RX called in before she sees her.  Can she do this for her.  She is doing well on the medication, just couldn't get in sooner to see her.  Please call.

## 2020-10-07 ENCOUNTER — Ambulatory Visit: Payer: Self-pay | Admitting: Pharmacist

## 2020-10-08 MED ORDER — TIRZEPATIDE 5 MG/0.5ML ~~LOC~~ SOAJ: 5.0000 mg | SUBCUTANEOUS | 1 refills | Status: DC

## 2020-10-08 NOTE — Telephone Encounter (Signed)
Please let patient know her mounjaro '5mg'$  RX has been called in to pharmacy  Increasing to [facility] '5mg'$  weekly for 4 weeks Will continue to titrate Please see pharmd for next appt and refill

## 2020-10-08 NOTE — Telephone Encounter (Signed)
Patient aware and verbalized understanding. °

## 2020-10-24 ENCOUNTER — Other Ambulatory Visit: Payer: Self-pay | Admitting: Family Medicine

## 2020-10-24 DIAGNOSIS — E1169 Type 2 diabetes mellitus with other specified complication: Secondary | ICD-10-CM

## 2020-10-28 ENCOUNTER — Other Ambulatory Visit: Payer: Self-pay

## 2020-10-28 ENCOUNTER — Ambulatory Visit: Payer: 59 | Admitting: Pharmacist

## 2020-10-28 DIAGNOSIS — E1169 Type 2 diabetes mellitus with other specified complication: Secondary | ICD-10-CM

## 2020-10-28 MED ORDER — TIRZEPATIDE 7.5 MG/0.5ML ~~LOC~~ SOAJ: 7.5000 mg | SUBCUTANEOUS | 1 refills | Status: DC

## 2020-10-28 NOTE — Progress Notes (Signed)
      10/28/2020 Name: Mary [name]       MRN: [id number]       DOB: 1965/05/17    S:  55 yoF Presents for diabetes evaluation, education, and management Patient was referred and last seen by Primary Care Provider on 09/09/20.   Insurance coverage/medication affordability: [employer]ER[employer]    Patient reports adherence with medications. Current diabetes medications include: MOUNJARO Current hypertension medications include: N/A Goal 130/80 Current hyperlipidemia medications include: ATORVASTATIN   Patient denies hypoglycemic events.   Discussed meal planning options and Plate method for healthy eating Avoid sugary drinks and desserts Incorporate balanced protein, non starchy veggies, 1 serving of carbohydrate with each meal Increase water intake Increase physical activity as able   Patient-reported exercise habits: N/A   O:   Recent Labs       Lab Results  Component Value Date    HGBA1C 6.0 09/09/2020      Lipid Panel   Labs (Brief)          Component Value Date/Time    CHOL 140 03/11/2020 0843    TRIG 109 03/11/2020 0843    HDL 59 03/11/2020 0843    CHOLHDL 2.4 03/11/2020 0843    LDLCALC 61 03/11/2020 0843        Home fasting blood sugars: <130   2 hour post-meal/random blood sugars: <180.     Clinical Atherosclerotic Cardiovascular Disease (ASCVD): No    The 10-year ASCVD risk score Mikey Bussing DC Jr., et al., 2013) is: 1.5%   Values used to calculate the score:     55 years     Sex: Female     Is Non-Hispanic African American: No     Diabetic: Yes     Tobacco smoker: No     Systolic Blood Pressure: [id number] mmHg     Is BP treated: No     HDL Cholesterol: 59 mg/dL     Total Cholesterol: 140 mg/dL    A/P:   Diabetes T2DM currently CONTROLLED, HOWEVER PATIENT IS LOOKING FOR ADDITIONAL WEIGHT LOSS.  WILL TRANSITION PATIENT TO MOUNJARO ($25 COPAY CARD REGARDLESS OF INSURANCE STATUS).   Denies personal and family history of Medullary thyroid cancer  (MTC) -Stopped metformin 2021  -INCREASED TO '5MG'$  2 WEEKS AGO  WILL START 7.'5MG'$  SQ WEEKLY IN 2 WEEKS  Denies personal and family history of Medullary thyroid cancer (MTC)  NO WEIGHT LOSS YET  DOES NOTICE A DIFFERENCE IN FEELING MORE FULL   -Extensively discussed pathophysiology of diabetes, recommended lifestyle interventions, dietary effects on blood sugar control   -Counseled on s/sx of and management of hypoglycemia   Written patient instructions provided.  Total time in face to face counseling 25 minutes.    Follow up Pharmacist in 4 weeks for titration    Regina Eck, PharmD, BCPS Clinical Pharmacist, [place]  II Phone [phone]

## 2020-11-18 ENCOUNTER — Telehealth: Payer: Self-pay | Admitting: Adult Health

## 2020-11-18 NOTE — Telephone Encounter (Signed)
Appt time changed due to NP request.

## 2020-11-19 ENCOUNTER — Encounter: Payer: Self-pay | Admitting: Adult Health

## 2020-11-19 ENCOUNTER — Ambulatory Visit: Payer: 59 | Admitting: Adult Health

## 2020-11-19 VITALS — BP 112/76 | HR 67 | Ht 64.0 in | Wt 258.4 lb

## 2020-11-19 DIAGNOSIS — G4733 Obstructive sleep apnea (adult) (pediatric): Secondary | ICD-10-CM

## 2020-11-19 DIAGNOSIS — Z9989 Dependence on other enabling machines and devices: Secondary | ICD-10-CM

## 2020-11-19 NOTE — Progress Notes (Signed)
PATIENT: Mary [name] DOB: 09-15-1965  REASON FOR VISIT: follow up HISTORY FROM: patient PRIMARY NEUROLOGIST: Dr. Brett Fairy  HISTORY OF PRESENT ILLNESS: Today 11/19/20:  05/28/20:Mary [name] is a 55 year old female with a history of obstructive sleep apnea on CPAP.  Her download indicates that she use her machine 26 out of 30 days for compliance of 87%.  She used her machine greater than 4 hours each night.  On average she uses her machine 6 hours and 6 minutes.  Her residual AHI is 1.8.  She reports that this is working well.  She denies any new issues.  She returns today for an evaluation.  HISTORY (Copied from Dr.Dohmeier's note) Mary [name] is a 55 y.o. Caucasian female patient and seen on 05/28/2020 - for a sleep consultation.  Chief concern according to patient :  Pt states that she is here to eval if sleep is a concern. Overall avg 6-7 hrs of sleep which is fragmented. Wakes up feeling tired and as the day goes on the fatigue worsens. She admits to snoring. She had weight gain.  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   Sleep relevant medical history: Nocturia , she wakes up and urinates, it is not the urge to wake her. No Tonsillectomy, no ENT surgery,   Family medical /sleep history: No other family member on CPAP with OSA, insomnia, sleep walkers. sister is a breast cancer survivor with chemotherapy induced neuropathy. .    Social history:  Patient is working as  Banker, desk job- and lives in a household with spouse and l daughter has moved out, her dog stayed..   The patient currently works in shifts( formerly rotating,) now early shift.    Tobacco use; none , had smoked some in 1990.Marland Kitchen  ETOH use : rarely,  Caffeine intake in form of Coffee( 4 cups) Soda(  1 a day) Tea ( /) or energy drinks. Regular exercise in form of pickle ball.     Sleep habits are as follows: The patient's dinner time is between 6-7 PM. The patient goes to bed at 9-10 PM and she has indigestion, she usually continues to sleep in intervals of 3 hours, wakes and goes for bathroom breaks.GERD. bedroom is cool, dark and quiet- her daughter dog may interrupt her sleep.  The preferred sleep position is side ways, with the support of 2 pillows. Head elevated, Dreams are reportedly rare.  5  AM is the usual rise time. The patient wakes up spontaneously before alarm. She reports not feeling refreshed or restored in AM, with symptoms such as rare morning headaches and neck stiffness,  residual fatigue.  Naps are taken frequently, lasting from 30-90 minutes and are more refreshing than nocturnal sleep.     REVIEW OF SYSTEMS: Out of a complete 14 system review of symptoms, the patient complains only of the following symptoms, and all other reviewed systems are negative.   ESS 3  ALLERGIES: No Known Allergies  HOME MEDICATIONS: Outpatient Medications Prior to Visit  Medication Sig Dispense Refill   atorvastatin (LIPITOR) 40 MG tablet TAKE ONE TABLET DAILY AT BEDTIME 90 tablet 1   Blood Glucose Monitoring Suppl (BLOOD  GLUCOSE MONITOR SYSTEM) w/Device KIT 1 Device 2 (two) times daily by Does not apply route. 1 each 0   famotidine (PEPCID) 10 MG tablet Take 10 mg by mouth daily.     fexofenadine (ALLEGRA) 180 MG tablet Take 180 mg by mouth daily.     glucose blood test strip Use as instructed 100 each 12   ibuprofen (ADVIL,MOTRIN) 200 MG tablet Take 200 mg by mouth as needed for pain.     Lancets MISC Use to test blood sugar twice daily. 100 each 3   medroxyPROGESTERone (PROVERA) 10 MG  tablet medroxyprogesterone 10 mg tablet     Misc Natural Products (OSTEO BI-FLEX/5-LOXIN ADVANCED PO) Osteo Bi-Flex (5-Loxin)     tirzepatide (MOUNJARO) 7.5 MG/0.5ML Pen Inject 7.5 mg into the skin once a week. 2 mL 1   venlafaxine XR (EFFEXOR-XR) 75 MG 24 hr capsule Take 150 mg by mouth.      vitamin C (ASCORBIC ACID) 500 MG tablet Take 500 mg by mouth daily.     ferrous sulfate 325 (65 FE) MG tablet Take 325 mg by mouth 2 (two) times daily with a meal.     No facility-administered medications prior to visit.    PAST MEDICAL HISTORY: Past Medical History:  Diagnosis Date   Allergy    seasonal   Anemia    Anxiety    Arthritis    Diabetes mellitus without complication (HCC)    GERD (gastroesophageal reflux disease)    Mononucleosis    Seasonal allergies    Sleep apnea    has not gotten c-pap yet   Viral hepatitis 10/2016    PAST SURGICAL HISTORY: Past Surgical History:  Procedure Laterality Date   CARPAL TUNNEL RELEASE Left 12/05/2012   Procedure: CARPAL TUNNEL RELEASE;  Surgeon: Sanjuana Kava, MD;  Location: [facility];  Service: Orthopedics;  Laterality: Left;   COLONOSCOPY     TUBAL LIGATION  2004    FAMILY HISTORY: Family History  Problem Relation Age of Onset   Cancer Mother 75       colon   Breast cancer Sister    Colon polyps Sister    Diabetes Brother    Heart disease Paternal Grandmother    Colon cancer Neg Hx    Esophageal cancer Neg Hx    Rectal cancer Neg Hx    Stomach cancer Neg Hx    Sleep apnea Neg Hx     SOCIAL HISTORY: Social History   Socioeconomic History   Marital status: Married    Spouse name: Not on file   Number of children: Not on file   Years of education: Not on file   Highest education level: Not on file  Occupational History   Not on file  Tobacco Use   Smoking status: Never   Smokeless tobacco: Never  Vaping Use   Vaping Use: Never used  Substance and Sexual Activity   Alcohol use: Yes    Comment: occasional   Drug use: No    Sexual activity: Yes  Other Topics Concern   Not on file  Social History Narrative   Not on file   Social Determinants of Health   Financial Resource Strain: Not on file  Food Insecurity: Not on file  Transportation Needs: Not on file  Physical Activity: Not on file  Stress: Not on file  Social Connections: Not on file  Intimate Partner Violence: Not on file      PHYSICAL EXAM  Vitals:   11/19/20 1325  BP: 112/76  Pulse: 67  Weight: 258 lb 6.4 oz (117.2 kg)  Height: _0  (1.626 m)   Body mass index is 44.35 kg/m.  Generalized: Well developed, in no acute distress  Chest: Lungs clear to auscultation bilaterally  Neurological examination  Mentation: Alert oriented to time, place, history taking. Follows all commands speech and language fluent Cranial nerve II-XII: Extraocular movements were full, visual field were full on confrontational test Head turning and shoulder shrug  were normal and symmetric. Motor: The motor testing reveals 5 over 5 strength of all 4 extremities. Good symmetric motor tone is noted throughout.  Sensory: Sensory testing is intact to soft touch on all 4 extremities. No evidence of extinction is noted.  Gait and station: Gait is normal.    DIAGNOSTIC DATA (LABS, IMAGING, TESTING) - I reviewed patient records, labs, notes, testing and imaging myself where available.  Lab Results  Component Value Date   WBC 8.4 06/18/2020   HGB 11.2 06/18/2020   HCT 34.9 06/18/2020   MCV 81 06/18/2020   PLT 324 06/18/2020      Component Value Date/Time   NA 141 12/11/2019 1618   K 4.5 12/11/2019 1618   CL 100 12/11/2019 1618   CO2 27 12/11/2019 1618   GLUCOSE 89 12/11/2019 1618   GLUCOSE 181 (H) 01/18/2017 1428   BUN 16 12/11/2019 1618   CREATININE 0.75 12/11/2019 1618   CALCIUM 9.6 12/11/2019 1618   PROT 6.7 12/11/2019 1618   ALBUMIN 4.3 12/11/2019 1618   AST 18 12/11/2019 1618   ALT 17 12/11/2019 1618   ALKPHOS 95 12/11/2019 1618   BILITOT  0.4 12/11/2019 1618   GFRNONAA 91 12/11/2019 1618   GFRAA 105 12/11/2019 1618   Lab Results  Component Value Date   CHOL 140 03/11/2020   HDL 59 03/11/2020   LDLCALC 61 03/11/2020   TRIG 109 03/11/2020   CHOLHDL 2.4 03/11/2020   Lab Results  Component Value Date   HGBA1C 6.0 09/09/2020   Lab Results  Component Value Date   VITAMINB12 608 06/18/2020   Lab Results  Component Value Date   TSH 2.200 08/29/2018      ASSESSMENT AND PLAN 55 y.o. year old female  has a past medical history of Allergy, Anemia, Anxiety, Arthritis, Diabetes mellitus without complication ([place]), GERD (gastroesophageal reflux disease), Mononucleosis, Seasonal allergies, Sleep apnea, and Viral hepatitis (10/2016). here with:  OSA on CPAP  - CPAP compliance excellent - Good treatment of AHI  - Encourage patient to use CPAP nightly and > 4 hours each night - F/U in 1 year or sooner if needed    Ward Givens, MSN, NP-C 11/19/2020, 1:29 PM [facility] [address] [place], [place] [address] [phone]

## 2021-01-05 ENCOUNTER — Telehealth: Payer: Self-pay | Admitting: Family Medicine

## 2021-01-05 MED ORDER — TIRZEPATIDE 7.5 MG/0.5ML ~~LOC~~ SOAJ: 7.5000 mg | SUBCUTANEOUS | 0 refills | Status: DC

## 2021-01-05 NOTE — Telephone Encounter (Signed)
Pt aware refill sent to pharmacy 

## 2021-01-05 NOTE — Telephone Encounter (Signed)
  Prescription Request  01/05/2021  Is this a "Controlled Substance" medicine? no Have you seen your PCP in the last 2 weeks? No  If YES, route message to pool  -  If NO, patient needs to be scheduled for appointment.  What is the name of the medication or equipment? Tirzepatide 7.5 mg/0.5 ML pen  Have you contacted your pharmacy to request a refill? yes  Which [facility] you [facility]   Patient notified that their request is being sent to the clinical staff for review and that they should receive a response within 2 business days.

## 2021-01-05 NOTE — Telephone Encounter (Signed)
Lmtcb  Seen Mary [name] on 8/16 and Mary [name] said to Follow up Pharmacist in 4 weeks for titration Please schedule when she calls back

## 2021-01-12 ENCOUNTER — Other Ambulatory Visit: Payer: Self-pay | Admitting: Family Medicine

## 2021-01-12 ENCOUNTER — Ambulatory Visit: Payer: 59 | Admitting: Family Medicine

## 2021-01-13 ENCOUNTER — Ambulatory Visit: Payer: 59 | Admitting: Pharmacist

## 2021-01-22 ENCOUNTER — Other Ambulatory Visit: Payer: Self-pay

## 2021-01-22 ENCOUNTER — Ambulatory Visit: Payer: 59 | Admitting: Pharmacist

## 2021-01-22 VITALS — Wt 246.4 lb

## 2021-01-22 DIAGNOSIS — E119 Type 2 diabetes mellitus without complications: Secondary | ICD-10-CM

## 2021-01-22 MED ORDER — TIRZEPATIDE 10 MG/0.5ML ~~LOC~~ SOAJ: 10.0000 mg | SUBCUTANEOUS | 3 refills | Status: DC

## 2021-01-22 NOTE — Progress Notes (Signed)
    01/22/2021 Name: Mary [name]       MRN: [id number]       DOB: [birth year]    S:  55 yoF Presents for diabetes evaluation, education, and management Patient was referred and last seen by Primary Care Provider on 09/09/20.  Patient continues on Mounjaro (currently 7.5mg  dose) and is seeing great results with weight loss of about 20lbs.  She denies side effects.   Insurance coverage/medication affordability: [employer]ER[employer]    Patient reports adherence with medications. Current diabetes medications include: MOUNJARO Current hypertension medications include: N/A Goal 130/80 Current hyperlipidemia medications include: ATORVASTATIN   Patient denies hypoglycemic events.   Discussed meal planning options and Plate method for healthy eating Avoid sugary drinks and desserts Incorporate balanced protein, non starchy veggies, 1 serving of carbohydrate with each meal Increase water intake Increase physical activity as able   Patient-reported exercise habits: N/A   O:   Recent Labs           Lab Results  Component Value Date    HGBA1C 6.0 09/09/2020      Lipid Panel   Labs (Brief)              Component Value Date/Time    CHOL 140 03/11/2020 0843    TRIG 109 03/11/2020 0843    HDL 59 03/11/2020 0843    CHOLHDL 2.4 03/11/2020 0843    LDLCALC 61 03/11/2020 0843        Home fasting blood sugars: <130   2 hour post-meal/random blood sugars: <180.     Clinical Atherosclerotic Cardiovascular Disease (ASCVD): No    The 10-year ASCVD risk score Mikey Bussing DC Jr., et al., 2013) is: 1.5%   Values used to calculate the score:     Age: 83 years     Sex: Female     Is Non-Hispanic African American: No     Diabetic: Yes     Tobacco smoker: No     Systolic Blood Pressure: 740 mmHg     Is BP treated: No     HDL Cholesterol: 59 mg/dL     Total Cholesterol: 140 mg/dL    A/P:   Diabetes T2DM currently CONTROLLED, HOWEVER PATIENT IS LOOKING FOR ADDITIONAL WEIGHT LOSS.  WILL  TRANSITION PATIENT TO MOUNJARO ($25 COPAY CARD REGARDLESS OF INSURANCE STATUS).   Denies personal and family history of Medullary thyroid cancer (MTC) -Stopped metformin 2021   -INCREASED MOUNJARO TO 10MG  SQ WEEKLY TODAY             Denies personal and family history of Medullary thyroid cancer (MTC)             26LBS WEIGHT LOSS TOTAL              DOES NOTICE A DIFFERENCE IN FEELING MORE FULL   -Extensively discussed pathophysiology of diabetes, recommended lifestyle interventions, dietary effects on blood sugar control   -Counseled on s/sx of and management of hypoglycemia   Written patient instructions provided.  Total time in face to face counseling 25 minutes.    Follow up with PCP in 4 weeks     Regina Eck, PharmD, BCPS Clinical Pharmacist, [place]  II Phone [phone]

## 2021-03-13 ENCOUNTER — Ambulatory Visit: Payer: 59 | Admitting: Family Medicine

## 2021-03-13 ENCOUNTER — Encounter: Payer: Self-pay | Admitting: Family Medicine

## 2021-03-13 VITALS — Wt 234.0 lb

## 2021-03-13 DIAGNOSIS — E785 Hyperlipidemia, unspecified: Secondary | ICD-10-CM | POA: Diagnosis not present

## 2021-03-13 DIAGNOSIS — E1169 Type 2 diabetes mellitus with other specified complication: Secondary | ICD-10-CM

## 2021-03-13 DIAGNOSIS — D649 Anemia, unspecified: Secondary | ICD-10-CM | POA: Diagnosis not present

## 2021-03-13 LAB — BAYER DCA HB A1C WAIVED: HB A1C (BAYER DCA - WAIVED): 5.3 % (ref 4.8–5.6)

## 2021-03-13 MED ORDER — ATORVASTATIN CALCIUM 40 MG PO TABS: 40.0000 mg | ORAL_TABLET | Freq: Every day | ORAL | 1 refills | Status: DC

## 2021-03-13 NOTE — Progress Notes (Signed)
Telephone visit  Subjective: CC:DM PCP: [name], DO OHY:WVPXT Mary [name] is a 55 y.o. female calls for telephone consult today. Patient provides verbal consent for consult held via phone.  Due to COVID-19 pandemic this visit was conducted virtually. This visit type was conducted due to national recommendations for restrictions regarding the COVID-19 Pandemic (e.g. social distancing, sheltering in place) in an effort to limit this patient's exposure and mitigate transmission in our community. All issues noted in this document were discussed and addressed.  A physical exam was not performed with this format.   Location of patient: car Location of provider: [facility] Others present for call: none  1. Type 2 Diabetes with hyperlipidemia:  She reports that she is doing really good on the Mounjaro 10 mg.  She reports no GI symptoms.  She has continued to lose weight steadily and she is very happy about this.  Has not gotten her diabetic eye exam but will schedule  Last eye exam: Needs Last foot exam: Needs Last A1c:  Lab Results  Component Value Date   HGBA1C 6.0 09/09/2020   Nephropathy screen indicated?:  Needs Last flu, zoster and/or pneumovax:  Immunization History  Administered Date(s) Administered   Influenza,inj,Quad PF,6+ Mos 12/04/2018   Influenza-Unspecified 12/07/2017   Moderna Sars-Covid-2 Vaccination 05/30/2019, 07/05/2019, 01/25/2020   Pneumococcal Polysaccharide-23 03/11/2020   Tdap 08/30/2017    ROS: No reports of chest pain, shortness of breath or dizziness   ROS: Per HPI  No Known Allergies Past Medical History:  Diagnosis Date   Allergy    seasonal   Anemia    Anxiety    Arthritis    Diabetes mellitus without complication (HCC)    GERD (gastroesophageal reflux disease)    Mononucleosis    Seasonal allergies    Sleep apnea    has not gotten c-pap yet   Viral hepatitis 10/2016    Current Outpatient Medications:    atorvastatin (LIPITOR) 40 MG  tablet, TAKE ONE TABLET DAILY AT BEDTIME, Disp: 90 tablet, Rfl: 1   Blood Glucose Monitoring Suppl (BLOOD GLUCOSE MONITOR SYSTEM) w/Device KIT, 1 Device 2 (two) times daily by Does not apply route., Disp: 1 each, Rfl: 0   famotidine (PEPCID) 10 MG tablet, Take 10 mg by mouth daily., Disp: , Rfl:    ferrous sulfate 325 (65 FE) MG tablet, Take 325 mg by mouth 2 (two) times daily with a meal., Disp: , Rfl:    fexofenadine (ALLEGRA) 180 MG tablet, Take 180 mg by mouth daily., Disp: , Rfl:    glucose blood test strip, Use as instructed, Disp: 100 each, Rfl: 12   ibuprofen (ADVIL,MOTRIN) 200 MG tablet, Take 200 mg by mouth as needed for pain., Disp: , Rfl:    Lancets MISC, Use to test blood sugar twice daily., Disp: 100 each, Rfl: 3   medroxyPROGESTERone (PROVERA) 10 MG tablet, medroxyprogesterone 10 mg tablet, Disp: , Rfl:    Misc Natural Products (OSTEO BI-FLEX/5-LOXIN ADVANCED PO), Osteo Bi-Flex (5-Loxin), Disp: , Rfl:    tirzepatide (MOUNJARO) 10 MG/0.5ML Pen, Inject 10 mg into the skin once a week., Disp: 2 mL, Rfl: 3   venlafaxine XR (EFFEXOR-XR) 75 MG 24 hr capsule, Take 150 mg by mouth. , Disp: , Rfl:    vitamin C (ASCORBIC ACID) 500 MG tablet, Take 500 mg by mouth daily., Disp: , Rfl:   Assessment/ Plan: 55 y.o. female   Type 2 diabetes mellitus with other specified complication, without long-term current use of insulin ([place]) -  Plan: Bayer DCA Hb A1c Waived, CMP14+EGFR  Hyperlipidemia associated with type 2 diabetes mellitus ([place]) - Plan: Lipid panel  Anemia, unspecified type - Plan: CBC with Differential/Platelet  Sugar under excellent control.  A1c less than 6 now.  She may continue the Mounjaro 10 mg and she will contact me if her weight begins a stall at which point we can advance the dose to 12.5 mg.  She had a nonfasting lipid panel performed today.  Hopefully will be able to come down on the statin at some point.  History of anemia and CBC was collected along with other labs  today  23-monthfollow-up/annual physical scheduled for March.  She is aware of date and time  Start time: 1:13pm End time: 1:25pm  Total time spent on patient care (including telephone call/ virtual visit): 12 minutes  [place]([phone]

## 2021-03-14 LAB — CBC WITH DIFFERENTIAL/PLATELET
Basophils Absolute: 0.1 10*3/uL (ref 0.0–0.2)
Basos: 1 %
EOS (ABSOLUTE): 0.2 10*3/uL (ref 0.0–0.4)
Eos: 2 %
Hematocrit: 43.8 % (ref 34.0–46.6)
Hemoglobin: 14.4 g/dL (ref 11.1–15.9)
Immature Grans (Abs): 0 10*3/uL (ref 0.0–0.1)
Immature Granulocytes: 0 %
Lymphocytes Absolute: 2.7 10*3/uL (ref 0.7–3.1)
Lymphs: 35 %
MCH: 29.8 pg (ref 26.6–33.0)
MCHC: 32.9 g/dL (ref 31.5–35.7)
MCV: 91 fL (ref 79–97)
Monocytes Absolute: 0.4 10*3/uL (ref 0.1–0.9)
Monocytes: 5 %
Neutrophils Absolute: 4.4 10*3/uL (ref 1.4–7.0)
Neutrophils: 57 %
Platelets: 245 10*3/uL (ref 150–450)
RBC: 4.84 x10E6/uL (ref 3.77–5.28)
RDW: 12.8 % (ref 11.7–15.4)
WBC: 7.8 10*3/uL (ref 3.4–10.8)

## 2021-03-14 LAB — CMP14+EGFR
ALT: 19 IU/L (ref 0–32)
AST: 22 IU/L (ref 0–40)
Albumin/Globulin Ratio: 1.7 (ref 1.2–2.2)
Albumin: 4.4 g/dL (ref 3.8–4.9)
Alkaline Phosphatase: 103 IU/L (ref 44–121)
BUN/Creatinine Ratio: 16 (ref 9–23)
BUN: 13 mg/dL (ref 6–24)
Bilirubin Total: 0.5 mg/dL (ref 0.0–1.2)
CO2: 24 mmol/L (ref 20–29)
Calcium: 9.3 mg/dL (ref 8.7–10.2)
Chloride: 108 mmol/L — ABNORMAL HIGH (ref 96–106)
Creatinine, Ser: 0.8 mg/dL (ref 0.57–1.00)
Globulin, Total: 2.6 g/dL (ref 1.5–4.5)
Glucose: 87 mg/dL (ref 70–99)
Potassium: 4.8 mmol/L (ref 3.5–5.2)
Sodium: 147 mmol/L — ABNORMAL HIGH (ref 134–144)
Total Protein: 7 g/dL (ref 6.0–8.5)
eGFR: 88 mL/min/{1.73_m2} (ref >59)

## 2021-03-14 LAB — LIPID PANEL
Chol/HDL Ratio: 2.4 ratio (ref 0.0–4.4)
Cholesterol, Total: 129 mg/dL (ref 100–199)
HDL: 53 mg/dL (ref >39)
LDL Chol Calc (NIH): 61 mg/dL (ref 0–99)
Triglycerides: 75 mg/dL (ref 0–149)
VLDL Cholesterol Cal: 15 mg/dL (ref 5–40)

## 2021-03-26 ENCOUNTER — Other Ambulatory Visit: Payer: Self-pay | Admitting: Obstetrics and Gynecology

## 2021-03-26 DIAGNOSIS — Z1231 Encounter for screening mammogram for malignant neoplasm of breast: Secondary | ICD-10-CM

## 2021-04-06 ENCOUNTER — Telehealth: Payer: Self-pay | Admitting: Family Medicine

## 2021-04-06 ENCOUNTER — Other Ambulatory Visit: Payer: Self-pay | Admitting: Family Medicine

## 2021-04-06 DIAGNOSIS — E1169 Type 2 diabetes mellitus with other specified complication: Secondary | ICD-10-CM

## 2021-04-06 MED ORDER — TIRZEPATIDE 12.5 MG/0.5ML ~~LOC~~ SOAJ: 12.5000 mg | SUBCUTANEOUS | 99 refills | Status: DC

## 2021-04-06 NOTE — Telephone Encounter (Signed)
done

## 2021-04-06 NOTE — Telephone Encounter (Signed)
°  Prescription Request  04/06/2021  Is this a "Controlled Substance" medicine? NO  Have you seen your PCP in the last 2 weeks? NO  If YES, route message to pool  -  If NO, patient needs to be scheduled for appointment.  What is the name of the medication or equipment? Tirzepatide 10 mg/0.5 ML Pen is on back order and the pharmacy has the 12.5 and wants that.  Have you contacted your pharmacy to request a refill? YES   [facility]   Patient notified that their request is being sent to the clinical staff for review and that they should receive a response within 2 business days.

## 2021-04-06 NOTE — Telephone Encounter (Signed)
Please advise, Mounjaro 10 mg/0.5 ml on back order [facility] has the 12.5 mg and pt would like this

## 2021-04-07 NOTE — Telephone Encounter (Signed)
Pt aware rx sent over.

## 2021-05-11 ENCOUNTER — Ambulatory Visit
Admission: RE | Admit: 2021-05-11 | Discharge: 2021-05-11 | Disposition: A | Discharge status: Home / Self Care | Payer: 59 | Source: Ambulatory Visit | Attending: Obstetrics and Gynecology | Admitting: Obstetrics and Gynecology

## 2021-05-11 DIAGNOSIS — Z1231 Encounter for screening mammogram for malignant neoplasm of breast: Secondary | ICD-10-CM

## 2021-06-10 ENCOUNTER — Encounter: Payer: 59 | Admitting: Family Medicine

## 2021-06-16 LAB — HM DIABETES EYE EXAM

## 2021-07-15 ENCOUNTER — Ambulatory Visit (INDEPENDENT_AMBULATORY_CARE_PROVIDER_SITE_OTHER): Payer: 59 | Admitting: Family Medicine

## 2021-07-15 ENCOUNTER — Encounter: Payer: Self-pay | Admitting: Family Medicine

## 2021-07-15 VITALS — BP 109/74 | HR 76 | Temp 97.6°F | Ht 64.0 in | Wt 222.6 lb

## 2021-07-15 DIAGNOSIS — Z0001 Encounter for general adult medical examination with abnormal findings: Secondary | ICD-10-CM

## 2021-07-15 DIAGNOSIS — G4733 Obstructive sleep apnea (adult) (pediatric): Secondary | ICD-10-CM | POA: Diagnosis not present

## 2021-07-15 DIAGNOSIS — E1169 Type 2 diabetes mellitus with other specified complication: Secondary | ICD-10-CM

## 2021-07-15 DIAGNOSIS — Z9989 Dependence on other enabling machines and devices: Secondary | ICD-10-CM

## 2021-07-15 DIAGNOSIS — Z23 Encounter for immunization: Secondary | ICD-10-CM

## 2021-07-15 DIAGNOSIS — W57XXXA Bitten or stung by nonvenomous insect and other nonvenomous arthropods, initial encounter: Secondary | ICD-10-CM

## 2021-07-15 DIAGNOSIS — E785 Hyperlipidemia, unspecified: Secondary | ICD-10-CM

## 2021-07-15 DIAGNOSIS — S80862A Insect bite (nonvenomous), left lower leg, initial encounter: Secondary | ICD-10-CM

## 2021-07-15 DIAGNOSIS — Z0000 Encounter for general adult medical examination without abnormal findings: Secondary | ICD-10-CM

## 2021-07-15 LAB — BAYER DCA HB A1C WAIVED: HB A1C (BAYER DCA - WAIVED): 4.7 % — ABNORMAL LOW (ref 4.8–5.6)

## 2021-07-15 MED ORDER — DOXYCYCLINE HYCLATE 100 MG PO TABS: ORAL_TABLET | ORAL | 0 refills | Status: DC

## 2021-07-15 MED ORDER — TIRZEPATIDE 15 MG/0.5ML ~~LOC~~ SOAJ: 15.0000 mg | SUBCUTANEOUS | 99 refills | Status: DC

## 2021-07-15 NOTE — Patient Instructions (Signed)
For future tick bites: ?If attached for more than 3 days or an unknown number of days OR tick is engorged with blood, use the Doxycyline.  Take 2 tablets as a SINGLE dose ONCE for prevention of lyme disease.  Should have enough for 5 tick bite episodes. ? ?Preventive Care 7-55 Years Old, Female ?Preventive care refers to lifestyle choices and visits with your health care provider that can promote health and wellness. Preventive care visits are also called wellness exams. ?What can I expect for my preventive care visit? ?Counseling ?Your health care provider may ask you questions about your: ?Medical history, including: ?Past medical problems. ?Family medical history. ?Pregnancy history. ?Current health, including: ?Menstrual cycle. ?Method of birth control. ?Emotional well-being. ?Home life and relationship well-being. ?Sexual activity and sexual health. ?Lifestyle, including: ?Alcohol, nicotine or tobacco, and drug use. ?Access to firearms. ?Diet, exercise, and sleep habits. ?Work and work Statistician. ?Sunscreen use. ?Safety issues such as seatbelt and bike helmet use. ?Physical exam ?Your health care provider will check your: ?Height and weight. These may be used to calculate your BMI (body mass index). BMI is a measurement that tells if you are at a healthy weight. ?Waist circumference. This measures the distance around your waistline. This measurement also tells if you are at a healthy weight and may help predict your risk of certain diseases, such as type 2 diabetes and high blood pressure. ?Heart rate and blood pressure. ?Body temperature. ?Skin for abnormal spots. ?What immunizations do I need? ? ?Vaccines are usually given at various ages, according to a schedule. Your health care provider will recommend vaccines for you based on your age, medical history, and lifestyle or other factors, such as travel or where you work. ?What tests do I need? ?Screening ?Your health care provider may recommend screening  tests for certain conditions. This may include: ?Lipid and cholesterol levels. ?Diabetes screening. This is done by checking your blood sugar (glucose) after you have not eaten for a while (fasting). ?Pelvic exam and Pap test. ?Hepatitis B test. ?Hepatitis C test. ?HIV (human immunodeficiency virus) test. ?STI (sexually transmitted infection) testing, if you are at risk. ?Lung cancer screening. ?Colorectal cancer screening. ?Mammogram. Talk with your health care provider about when you should start having regular mammograms. This may depend on whether you have a family history of breast cancer. ?BRCA-related cancer screening. This may be done if you have a family history of breast, ovarian, tubal, or peritoneal cancers. ?Bone density scan. This is done to screen for osteoporosis. ?Talk with your health care provider about your test results, treatment options, and if necessary, the need for more tests. ?Follow these instructions at home: ?Eating and drinking ? ?Eat a diet that includes fresh fruits and vegetables, whole grains, lean protein, and low-fat dairy products. ?Take vitamin and mineral supplements as recommended by your health care provider. ?Do not drink alcohol if: ?Your health care provider tells you not to drink. ?You are pregnant, may be pregnant, or are planning to become pregnant. ?If you drink alcohol: ?Limit how much you have to 0-1 drink a day. ?Know how much alcohol is in your drink. In the [place] one drink equals one 12 oz bottle of beer (355 mL), one 5 oz glass of wine (148 mL), or one 1? oz glass of hard liquor (44 mL). ?Lifestyle ?Brush your teeth every morning and night with fluoride toothpaste. Floss one time each day. ?Exercise for at least 30 minutes 5 or more days each week. ?Do not use  any products that contain nicotine or tobacco. These products include cigarettes, chewing tobacco, and vaping devices, such as e-cigarettes. If you need help quitting, ask your health care provider. ?Do not  use drugs. ?If you are sexually active, practice safe sex. Use a condom or other form of protection to prevent STIs. ?If you do not wish to become pregnant, use a form of birth control. If you plan to become pregnant, see your health care provider for a prepregnancy visit. ?Take aspirin only as told by your health care provider. Make sure that you understand how much to take and what form to take. Work with your health care provider to find out whether it is safe and beneficial for you to take aspirin daily. ?Find healthy ways to manage stress, such as: ?Meditation, yoga, or listening to music. ?Journaling. ?Talking to a trusted person. ?Spending time with friends and family. ?Minimize exposure to UV radiation to reduce your risk of skin cancer. ?Safety ?Always wear your seat belt while driving or riding in a vehicle. ?Do not drive: ?If you have been drinking alcohol. Do not ride with someone who has been drinking. ?When you are tired or distracted. ?While texting. ?If you have been using any mind-altering substances or drugs. ?Wear a helmet and other protective equipment during sports activities. ?If you have firearms in your house, make sure you follow all gun safety procedures. ?Seek help if you have been physically or sexually abused. ?What's next? ?Visit your health care provider once a year for an annual wellness visit. ?Ask your health care provider how often you should have your eyes and teeth checked. ?Stay up to date on all vaccines. ?This information is not intended to replace advice given to you by your health care provider. Make sure you discuss any questions you have with your health care provider. ?Document Revised: 08/27/2020 Document Reviewed: 08/27/2020 ?Elsevier Patient Education ? [place]. ? ?

## 2021-07-15 NOTE — Progress Notes (Signed)
? ?Mary [name] is a 56 y.o. female presents to office today for annual physical exam examination.   ? ?Concerns today include: ?1. Type 2 Diabetes with hypertension, hyperlipidemia:  ?Treated with Mounjaro 12.73m weekly, Lipitor.  Her blood pressure has been diet controlled.  She does not report any hypoglycemic episodes.  If anything she feels pretty good.  She still working towards achieving her weight loss goals of less than 200 pounds.  She is down from 270 at her max weight however.  [facility] and participate in events that she otherwise would have not been able to participate in.  No reports of abdominal pain, nausea, vomiting or chest pain.  Sometimes her stomach will get upset if she "eats the wrong thing" and she tends to avoid those activities as a result. ? ?Last eye exam: Up-to-date. ?Last foot exam: Needs ?Last A1c:  ?Lab Results  ?Component Value Date  ? HGBA1C 5.3 03/13/2021  ? ?Nephropathy screen indicated?:  needs ?Last flu, zoster and/or pneumovax:  ?Immunization History  ?Administered Date(s) Administered  ? Influenza,inj,Quad PF,6+ Mos 12/04/2018  ? Influenza-Unspecified 12/07/2017  ? Moderna Sars-Covid-2 Vaccination 05/30/2019, 07/05/2019, 01/25/2020  ? Pneumococcal Polysaccharide-23 03/11/2020  ? Tdap 08/30/2017  ? ? ?ROS: No chest pain, shortness of breath. ? ?Diet: Small portions as above, Exercise: No structured ?Last eye exam: Up-to-date at my eye doctor ?Last colonoscopy: Up-to-date ?Last mammogram: Up-to-date ?Last pap smear: Up-to-date ?Refills needed today: None ?Immunizations needed: ?Immunization History  ?Administered Date(s) Administered  ? Influenza,inj,Quad PF,6+ Mos 12/04/2018  ? Influenza-Unspecified 12/07/2017  ? Moderna Sars-Covid-2 Vaccination 05/30/2019, 07/05/2019, 01/25/2020  ? Pneumococcal Polysaccharide-23 03/11/2020  ? Tdap 08/30/2017  ? ? ? ?Past Medical History:  ?Diagnosis Date  ?  Allergy   ? seasonal  ? Anemia   ? Anxiety   ? Arthritis   ? [place]   ? GERD (gastroesophageal reflux disease)   ? Mononucleosis   ? Seasonal allergies   ? Sleep apnea   ? has not gotten c-pap yet  ? Viral hepatitis 10/2016  ? ?Social History  ? ?Socioeconomic History  ? Marital status: Married  ?  Spouse name: Not on file  ? Number of children: Not on file  ? Years of education: Not on file  ? Highest education level: Not on file  ?Occupational History  ? Not on file  ?Tobacco Use  ? Smoking status: Never  ? Smokeless tobacco: Never  ?Vaping Use  ? Vaping Use: Never used  ?Substance and Sexual Activity  ? Alcohol use: Yes  ?  Comment: occasional  ? Drug use: No  ? Sexual activity: Yes  ?Other Topics Concern  ? Not on file  ?Social History Narrative  ? Not on file  ? ?Social Determinants of Health  ? ?Financial Resource Strain: Not on file  ?Food Insecurity: Not on file  ?Transportation Needs: Not on file  ?Physical Activity: Not on file  ?Stress: Not on file  ?Social Connections: Not on file  ?Intimate Partner Violence: Not on file  ? ?Past Surgical History:  ?Procedure Laterality Date  ? CARPAL TUNNEL RELEASE Left 12/05/2012  ? Procedure: CARPAL TUNNEL RELEASE;  Surgeon: WSanjuana Kava MD;  Location: [facility];  Service: Orthopedics;  Laterality: Left;  ? COLONOSCOPY    ? TUBAL LIGATION  2004  ? ?Family History  ?Problem Relation Age of Onset  ? Cancer Mother  80  ?     colon  ? Breast cancer Sister   ? Colon polyps Sister   ? Diabetes Brother   ? Heart disease Paternal Grandmother   ? Colon cancer Neg Hx   ? Esophageal cancer Neg Hx   ? Rectal cancer Neg Hx   ? Stomach cancer Neg Hx   ? Sleep apnea Neg Hx   ? ? ?Current Outpatient Medications:  ?  atorvastatin (LIPITOR) 40 MG tablet, Take 1 tablet (40 mg total) by mouth at bedtime., Disp: 90 tablet, Rfl: 1 ?  Blood Glucose Monitoring Suppl (BLOOD GLUCOSE MONITOR SYSTEM) w/Device KIT, 1 Device 2 (two) times daily by Does not apply  route., Disp: 1 each, Rfl: 0 ?  famotidine (PEPCID) 10 MG tablet, Take 10 mg by mouth daily., Disp: , Rfl:  ?  ferrous sulfate 325 (65 FE) MG tablet, Take 325 mg by mouth 2 (two) times daily with a meal., Disp: , Rfl:  ?  fexofenadine (ALLEGRA) 180 MG tablet, Take 180 mg by mouth daily., Disp: , Rfl:  ?  glucose blood test strip, Use as instructed, Disp: 100 each, Rfl: 12 ?  ibuprofen (ADVIL,MOTRIN) 200 MG tablet, Take 200 mg by mouth as needed for pain., Disp: , Rfl:  ?  Lancets MISC, Use to test blood sugar twice daily., Disp: 100 each, Rfl: 3 ?  medroxyPROGESTERone (PROVERA) 10 MG tablet, medroxyprogesterone 10 mg tablet, Disp: , Rfl:  ?  Misc Natural Products (OSTEO BI-FLEX/5-LOXIN ADVANCED PO), Osteo Bi-Flex (5-Loxin), Disp: , Rfl:  ?  tirzepatide (MOUNJARO) 12.5 MG/0.5ML Pen, Inject 12.5 mg into the skin once a week., Disp: 6 mL, Rfl: PRN ?  venlafaxine XR (EFFEXOR-XR) 75 MG 24 hr capsule, Take 150 mg by mouth. , Disp: , Rfl:  ?  vitamin C (ASCORBIC ACID) 500 MG tablet, Take 500 mg by mouth daily., Disp: , Rfl:  ? ?No Known Allergies  ? ?ROS: ?Review of Systems ?Pertinent items noted in HPI and remainder of comprehensive ROS otherwise negative.   ? ?Physical exam ?BP 109/74   Pulse 76   Temp 97.6 ?F (36.4 ?C)   Ht 5' 4"  (1.626 m)   Wt 222 lb 9.6 oz (101 kg)   SpO2 98%   BMI 38.21 kg/m?  ?General appearance: alert, cooperative, appears stated age, no distress, and morbidly obese ?Head: Normocephalic, without obvious abnormality, atraumatic ?Eyes: negative findings: lids and lashes normal, conjunctivae and sclerae normal, corneas clear, and pupils equal, round, reactive to light and accomodation ?Ears: normal TM's and external ear canals both ears ?Nose: Nares normal. Septum midline. Mucosa normal. No drainage or sinus tenderness. ?Throat: lips, mucosa, and tongue normal; teeth and gums normal ?Neck: no adenopathy, no carotid bruit, supple, symmetrical, trachea midline, and thyroid not enlarged, symmetric,  no tenderness/mass/nodules ?Back: symmetric, no curvature. ROM normal. No CVA tenderness. ?Lungs: clear to auscultation bilaterally ?Heart: regular rate and rhythm, S1, S2 normal, no murmur, click, rub or gallop ?Abdomen: soft, non-tender; bowel sounds normal; no masses,  no organomegaly and obese ?Extremities: extremities normal, atraumatic, no cyanosis or edema ?Pulses: 2+ and symmetric ?Skin:  Multiple freckles and areas of skin damage.  Has some areas of insect bite without evidence of target lesions or evidence of secondary bacterial infection along left lower leg ?Lymph nodes: Cervical, supraclavicular, and axillary nodes normal. ?Neurologic: Grossly normal, see DM foot ?[place] [facility] Visit from 07/15/2021 in [place]  ?PHQ-2 Total Score 0  ? ?  ? ?Diabetic  Foot Exam - Simple   ?Simple Foot Form ?Diabetic Foot exam was performed with the following findings: Yes 07/15/2021 12:54 PM  ?Visual Inspection ?No deformities, no ulcerations, no other skin breakdown bilaterally: Yes ?Sensation Testing ?Intact to touch and monofilament testing bilaterally: Yes ?Pulse Check ?Posterior Tibialis and Dorsalis pulse intact bilaterally: Yes ?Comments ?  ? ?Assessment/ Plan: ?Mary [name] here for annual physical exam.  ? ?Annual physical exam ? ?Type 2 diabetes mellitus with other specified complication, without long-term current use of insulin (HCC) - Plan: Bayer DCA Hb A1c Waived, Microalbumin / creatinine urine ratio, tirzepatide (MOUNJARO) 15 MG/0.5ML Pen ? ?Hyperlipidemia associated with type 2 diabetes mellitus ([place]) - Plan: tirzepatide (MOUNJARO) 15 MG/0.5ML Pen ? ?OSA on CPAP ? ?Morbid obesity ([place]) - Plan: tirzepatide (MOUNJARO) 15 MG/0.5ML Pen ? ?Tick bite of left lower leg, initial encounter - Plan: doxycycline (VIBRA-TABS) 100 MG tablet ? ?We will fill out an ROI for eye exam.  Urine microalbumin collected today.  She is up-to-date on preventative health care otherwise.  We will  advance Mounjaro to 15 mg in efforts to continue reducing weight and maintain excellent control of blood sugar.  A1c was appropriate today.  Foot exam performed with no abnormalities noted ? ?Lipid panel norm

## 2021-07-16 LAB — MICROALBUMIN / CREATININE URINE RATIO
Creatinine, Urine: 207.5 mg/dL
Microalb/Creat Ratio: 5 mg/g creat (ref 0–29)
Microalbumin, Urine: 10.9 ug/mL

## 2021-10-30 ENCOUNTER — Other Ambulatory Visit: Payer: Self-pay | Admitting: Family Medicine

## 2021-10-30 DIAGNOSIS — E1169 Type 2 diabetes mellitus with other specified complication: Secondary | ICD-10-CM

## 2021-11-17 ENCOUNTER — Ambulatory Visit: Payer: 59 | Admitting: Family Medicine

## 2021-11-17 ENCOUNTER — Encounter: Payer: Self-pay | Admitting: Family Medicine

## 2021-11-17 VITALS — BP 94/63 | HR 59 | Temp 97.3°F | Ht 64.0 in | Wt 221.4 lb

## 2021-11-17 DIAGNOSIS — E1169 Type 2 diabetes mellitus with other specified complication: Secondary | ICD-10-CM

## 2021-11-17 DIAGNOSIS — Z23 Encounter for immunization: Secondary | ICD-10-CM | POA: Diagnosis not present

## 2021-11-17 DIAGNOSIS — E785 Hyperlipidemia, unspecified: Secondary | ICD-10-CM

## 2021-11-17 LAB — BAYER DCA HB A1C WAIVED: HB A1C (BAYER DCA - WAIVED): 5.2 % (ref 4.8–5.6)

## 2021-11-17 NOTE — Progress Notes (Signed)
Subjective: CC:DM PCP: Mary Norlander, DO TDH:RCBUL Mary [name] is a 56 y.o. female presenting to clinic today for:  1. Type 2 Diabetes with hyperlipidemia:  Doing well.  No hypoglycemic episodes.  Compliant with all medications.  Last eye exam: done ROI completed Last foot exam: UTD Last A1c:  Lab Results  Component Value Date   HGBA1C 4.7 (L) 07/15/2021   Nephropathy screen indicated?: UTD Last flu, zoster and/or pneumovax:  Immunization History  Administered Date(s) Administered   Influenza,inj,Quad PF,6+ Mos 12/04/2018   Influenza-Unspecified 12/07/2017   Moderna Sars-Covid-2 Vaccination 05/30/2019, 07/05/2019, 01/25/2020   Pneumococcal Polysaccharide-23 03/11/2020   Tdap 08/30/2017   Zoster Recombinat (Shingrix) 07/15/2021    ROS: No pain, shortness of breath, falls.  Weight loss has been stagnant however.  She admits that she is not necessarily following a strict diet nor exercising regularly    ROS: Per HPI  No Known Allergies Past Medical History:  Diagnosis Date   Allergy    seasonal   Anemia    Anxiety    Arthritis    Diabetes mellitus without complication (HCC)    GERD (gastroesophageal reflux disease)    Mononucleosis    Seasonal allergies    Sleep apnea    has not gotten c-pap yet   Viral hepatitis 10/2016    Current Outpatient Medications:    atorvastatin (LIPITOR) 40 MG tablet, TAKE ONE TABLET BY MOUTH AT BEDTIME, Disp: 90 tablet, Rfl: 0   Blood Glucose Monitoring Suppl (BLOOD GLUCOSE MONITOR SYSTEM) w/Device KIT, 1 Device 2 (two) times daily by Does not apply route., Disp: 1 each, Rfl: 0   doxycycline (VIBRA-TABS) 100 MG tablet, Take 2 tablets as a single dose for 1 day as directed as needed for tick bite, Disp: 10 tablet, Rfl: 0   famotidine (PEPCID) 10 MG tablet, Take 10 mg by mouth daily. (Patient not taking: Reported on 07/15/2021), Disp: , Rfl:    fexofenadine (ALLEGRA) 180 MG tablet, Take 180 mg by mouth daily., Disp: , Rfl:     glucose blood test strip, Use as instructed, Disp: 100 each, Rfl: 12   ibuprofen (ADVIL,MOTRIN) 200 MG tablet, Take 200 mg by mouth as needed for pain., Disp: , Rfl:    Lancets MISC, Use to test blood sugar twice daily., Disp: 100 each, Rfl: 3   medroxyPROGESTERone (PROVERA) 10 MG tablet, medroxyprogesterone 10 mg tablet (Patient not taking: Reported on 07/15/2021), Disp: , Rfl:    Misc Natural Products (OSTEO BI-FLEX/5-LOXIN ADVANCED PO), Osteo Bi-Flex (5-Loxin) (Patient not taking: Reported on 07/15/2021), Disp: , Rfl:    tirzepatide (MOUNJARO) 15 MG/0.5ML Pen, Inject 15 mg into the skin once a week., Disp: 6 mL, Rfl: PRN   venlafaxine XR (EFFEXOR-XR) 75 MG 24 hr capsule, Take 150 mg by mouth. , Disp: , Rfl:    vitamin C (ASCORBIC ACID) 500 MG tablet, Take 500 mg by mouth daily., Disp: , Rfl:  Social History   Socioeconomic History   Marital status: Married    Spouse name: Not on file   Number of children: Not on file   Years of education: Not on file   Highest education level: Not on file  Occupational History   Not on file  Tobacco Use   Smoking status: Never   Smokeless tobacco: Never  Vaping Use   Vaping Use: Never used  Substance and Sexual Activity   Alcohol use: Yes    Comment: occasional   Drug use: No   Sexual activity: Yes  Other Topics Concern   Not on file  Social History Narrative   Not on file   Social Determinants of Health   Financial Resource Strain: Not on file  Food Insecurity: Not on file  Transportation Needs: Not on file  Physical Activity: Not on file  Stress: Not on file  Social Connections: Not on file  Intimate Partner Violence: Not on file   Family History  Problem Relation Age of Onset   Cancer Mother 48       colon   Breast cancer Sister    Colon polyps Sister    Diabetes Brother    Heart disease Paternal Grandmother    Colon cancer Neg Hx    Esophageal cancer Neg Hx    Rectal cancer Neg Hx    Stomach cancer Neg Hx    Sleep apnea Neg  Hx     Objective: Office vital signs reviewed. BP 94/63   Pulse (!) 59   Temp (!) 97.3 F (36.3 C)   Ht 5' 4"  (1.626 m)   Wt 221 lb 6.4 oz (100.4 kg)   SpO2 99%   BMI 38.00 kg/m   Physical Examination:  General: Awake, alert, morbidly obese, No acute distress HEENT: Sclera white.  Moist mucous membranes Cardio: regular rate and rhythm, S1S2 heard, no murmurs appreciated Pulm: clear to auscultation bilaterally, no wheezes, rhonchi or rales; normal work of breathing on room air  Assessment/ Plan: 56 y.o. female   Type 2 diabetes mellitus with other specified complication, without long-term current use of insulin ([place]) - Plan: Basic Metabolic Panel, Bayer DCA Hb A1c Waived  Hyperlipidemia associated with type 2 diabetes mellitus ([place]) - Plan: Basic Metabolic Panel, Bayer DCA Hb A1c Waived  Morbid obesity ([place]) - Plan: Basic Metabolic Panel, Bayer DCA Hb A1c Waived  Sugar under excellent control.  Check BMP.  Continue statin.  Weight is stable.  Discussed addition of exercise to promote increased metabolism  No orders of the defined types were placed in this encounter.  No orders of the defined types were placed in this encounter.    Mary Norlander, DO [place] [phone]

## 2021-11-18 LAB — BASIC METABOLIC PANEL
BUN/Creatinine Ratio: 13 (ref 9–23)
BUN: 11 mg/dL (ref 6–24)
CO2: 23 mmol/L (ref 20–29)
Calcium: 9.3 mg/dL (ref 8.7–10.2)
Chloride: 106 mmol/L (ref 96–106)
Creatinine, Ser: 0.83 mg/dL (ref 0.57–1.00)
Glucose: 98 mg/dL (ref 70–99)
Potassium: 4.7 mmol/L (ref 3.5–5.2)
Sodium: 144 mmol/L (ref 134–144)
eGFR: 83 mL/min/{1.73_m2} (ref >59)

## 2021-11-19 ENCOUNTER — Ambulatory Visit: Payer: 59 | Admitting: Adult Health

## 2021-12-07 ENCOUNTER — Ambulatory Visit: Payer: 59 | Admitting: Neurology

## 2022-01-05 ENCOUNTER — Telehealth: Payer: Self-pay

## 2022-01-05 NOTE — Telephone Encounter (Signed)
Called pt and informed her to bring CPAP memory card. Pt verbalized understanding. Pt had no questions at this time but was encouraged to call back if questions arise.

## 2022-01-07 ENCOUNTER — Encounter: Payer: Self-pay | Admitting: Neurology

## 2022-01-07 ENCOUNTER — Ambulatory Visit: Payer: 59 | Admitting: Neurology

## 2022-01-07 VITALS — BP 134/90 | HR 66 | Ht 64.0 in | Wt 220.0 lb

## 2022-01-07 DIAGNOSIS — E119 Type 2 diabetes mellitus without complications: Secondary | ICD-10-CM | POA: Diagnosis not present

## 2022-01-07 DIAGNOSIS — G4733 Obstructive sleep apnea (adult) (pediatric): Secondary | ICD-10-CM

## 2022-01-07 NOTE — Patient Instructions (Signed)
KEEPING IT CLEAN: CPAP HYGIENE PROPER UPKEEP OF YOUR CPAP MACHINE CAN HELP ENSURE THE DEVICE FUNCTIONS PROPERLY CPAP CLEANING INSTRUCTIONS Along with proper CPAP cleaning it is recommended that you replace your mask, tubing and filters once very 3 months and more frequently if you are sick.   DAILY CLEANING Do not use moisturizing soaps, bleach, scented oils, chlorine, or alcohol-based solutions to clean your supplies. These solutions may cause irritation to your skin and lungs and may reduce the life of your products. [employer] or Comparable works best for daily cleaning.  **If you've been sick, it's smart to wash your mask, tubing, humidifier and filter daily until your cold, flu or virus symptoms are gone. That can help reduce the amount of time you spend under the weather.  Before using your mask -wash your face daily with soap and water to remove excess facial oils. Wipe down your mask (including areas that come in contact with your skin) using a damp towel with soap and warm water. This will remove any oils, dead skin cells, and sweat on the mask that can affect the quality of the seal. Gently rinse with a clean towel and let the mask air-dry out of direct sunlight. You can also use unscented baby wipes or pre-moistened towels designed specifically for cleaning CPAP masks, which are available on-line. DO NOT USE CLOROX OR DISINFECTING WIPES. If your unit has a humidifier, empty any leftover water instead of letting in sit in the unit all day. Refill the humidifier with clean, distilled water right before bedtime for optimal use WEEKLY (OR MORE FREQUENT) CLEANING Your mask and tubing need a full bath at least once a week to keep it free of dust, bacteria, and germs. (During COVID-19 or any other flu/virus we recommend more frequent cleaning) Clean the CPAP tubing, nasal mask, and headgear in a bathroom sink filled with warm water and a few drops of ammonia-free, mild dish detergent. Avoid  using stronger cleaning products, as they may damage the mask or leave harmful residue. Swirl all parts around for about five minutes, rinse well and let air dry during the day. Hang the tubing over the shower rod, on a towel rack or in the laundry room to ensure all the water drips out. The mask and headgear can be air-dried on a towel or hung on a hook or hanger. You should also wipe down your CPAP machine with a damp cloth. Ensure the unit is unplugged. The towel shouldn't be too damp or wet, as water could get into the machine. Clean the filter by removing it and rinsing it in warm tap water. Run it under the water and squeeze to make sure there is no dust. Then blot down the filter with a towel. Do not wash your machine's white filter, if one is present--those are disposable and should be replaced every two weeks. If you are recovering from being sick, we recommend changing the filter sooner. If your CPAP has a humidifier, that also needs to be cleaned weekly. Empty any remaining water and then wash the water chamber in the sink with warm soapy water. Rinse well and drain out as much of the water as possible. Let the chamber air-dry before placing it back into the CPAP unit. Every other week you should disinfect the humidifier. Do that by soaking it in a solution of one-part vinegar to five parts water for 30 minutes, thoroughly rinsing and then placing in your dishwasher's top rack for washing. And keep  it clean by using only distilled water to prevent mineral deposits that can build up and cause damage to your machine. IMPORTANT TIPS Make caring for your CPAP equipment part of your morning routine. Keep machine and accessories out of direct sunlight to avoid damaging them. Never use bleach to clean accessories. Place machine on a level surface and away from curtains that may interfere with the air intake. Keep track of when you should order replacement parts for your mask and accessories so that  you always get the most out of your CPAP

## 2022-01-07 NOTE — Progress Notes (Signed)
SLEEP MEDICINE CLINIC    Provider:  Larey Seat, MD  Primary Care Physician:  [name], DO [place] [address]     Referring Provider: Halford Chessman [place],  [place] [id number]          Chief Complaint according to patient   Patient presents with:     CPAP visit.      Pt alone, rm 11. States that overall things are stable. Cpap is working well. DME [employer]      HISTORY OF PRESENT ILLNESS:  Mary [name] is a 56 y.o. Caucasian female patient and seen on 01/07/2022 -  In a RV after she started on autotitration therapy. 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  There was significant hypoxemia during sleep, by duration and nadir.  The patient was set up with a Resvent machine and her compliance is 67% currently so I would like her to come up to 75 or 80% of use.  Her usage hours per day total  4.7 and 4 days of use 7 hours.  She has been using an autotitrator between 6 and 18 cmH2O pressure range the 95th percentile pressure is 9.5 cm water and her residual AHI is 1.8/h.  This is an excellent resolution of apnea and no central apneas have arisen under therapy.  She stated that she had to urgently leave town for couple of days and these 4 days will be in would make a difference in her compliance.   I see that there is no high air leakage on the contrary her mask is sealing very well.  At her last visit with Vaughan Browner in September 2022 her body mass index was 44.4 and today is 37.8.  So weight loss  is definitely one of the changes that have happened.  She is on [facility] lost 40 pounds since 12-2020-  She feels overall better since she sleeps on CPAP is more restored and refreshed . She sleeps through the night now, no nocturia - used to go 3 times at night-  Reduced degree of of sleepiness.  Today's Epworth sleepiness scale was endorsed at 6 points/ 24. Pre CPAP therapy this was 11 points . Post CPAP FSS endorsed at  26 /63 points. No depression symptoms.  She only works first shift now, not rotating shift time. Her diabetes has been much better controlled.    11/19/20:MM, OSA on CPAP   05/28/20:Ms. Mungia is a 56 year old female with a history of obstructive sleep apnea on CPAP.  Her download indicates that she use her machine 26 out of 30 days for compliance of 87%.  She used her machine greater than 4 hours each night.  On average she uses her machine 6 hours and 6 minutes.  Her residual AHI is 1.8.  She reports that this is working well.  She denies any new issues.  She returns today for an evaluation.  HISTORY (Copied from Dr.Lyan Holck's note) Mary [name] is a 56 y.o. Caucasian female patient and seen on 05/28/2020 - for a sleep consultation.  Chief concern according to patient :  Pt states that she is here to eval if sleep is a concern. Overall avg 6-7 hrs of sleep which is fragmented. Wakes up feeling tired and as the day goes on the fatigue worsens. She admits to snoring. She had weight gain.   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 She was seen on 05-28-2020 for a sleep consultation.  Chief  concern according to patient :  Pt states that she is here to eval if sleep is a concern. Overall avg 6-7 hrs of sleep which is fragmented. Wakes up feeling tired and as the day goes on the fatigue worsens. She admits to snoring. She had weight gain.  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   Sleep relevant medical history: Nocturia , she wakes up and urinates, it is not the urge to wake her. No Tonsillectomy, no ENT surgery,   Family medical /sleep history: No other family member on CPAP with OSA, insomnia, sleep walkers. sister is a breast cancer survivor with chemotherapy induced neuropathy. .    Social history:  Patient is working as  Banker, desk job- and lives in a household with spouse and l daughter has moved out, her dog stayed..  The patient currently works in shifts( formerly rotating,) now early shift.   Tobacco use; none , had smoked some in 1990.Marland Kitchen  ETOH use : rarely,  Caffeine intake in form of Coffee( 4 cups) Soda(  1 a day) Tea ( /) or energy drinks. Regular exercise in form of pickle ball.     Sleep habits are as follows: The patient's dinner time is between 6-7 PM. The patient goes to bed at 9-10 PM and she has indigestion, she usually continues to sleep in intervals of 3 hours, wakes and goes for bathroom breaks.GERD. bedroom is cool, dark and quiet- her daughter dog may interrupt her sleep.  The preferred sleep position is side ways, with the support of 2 pillows. Head elevated, Dreams are reportedly rare.  5  AM is the usual rise time. The patient wakes up spontaneously  before alarm. She reports not feeling refreshed or restored in AM, with symptoms such as rare morning headaches and neck stiffness,  residual fatigue.  Naps are taken frequently, lasting from 30-90 minutes and are more refreshing than nocturnal sleep.    Review of Systems: Out of a complete 14 system review, the patient complains of only the following symptoms, and all other reviewed systems are negative.:  Fatigue, sleepiness , snoring, fragmented sleep, Insomnia, nocturia,  all improved   Post viral fatigued- for months after mononucleosis, one time COVID was asymptomatic.    Hearing loss on the left   How likely are you to doze in the following situations: 0 = not likely, 1 = slight chance, 2 = moderate chance, 3 = high chance   Sitting and Reading? Watching Television? Sitting inactive  in a public place (theater or meeting)? As a passenger in a car for an hour without a break? Lying down in the afternoon when circumstances permit? Sitting and talking to someone? Sitting quietly after lunch without alcohol? In a car, while stopped for a few minutes in traffic?   Total =   now 6 from 11/ 24 points   FSS endorsed at 25/ 63 points.   Social History   Socioeconomic History   Marital status: Married    Spouse name: Not on file   Number of children: Not on file   Years of education: Not on file   Highest education level: Not on file  Occupational History   Not on file  Tobacco Use   Smoking status: Never   Smokeless tobacco: Never  Vaping Use   Vaping Use: Never used  Substance and Sexual Activity   Alcohol use: Yes    Comment: occasional   Drug use: No   Sexual activity: Yes  Other Topics Concern   Not on file  Social History Narrative   Not on file   Social Determinants of Health   Financial Resource Strain: Not on file  Food Insecurity: Not on file  Transportation Needs: Not on file  Physical Activity: Not on file  Stress: Not on file  Social Connections:  Not on file    Family History  Problem Relation Age of Onset   Cancer Mother 78       colon   Breast cancer Sister    Colon polyps Sister    Diabetes Brother    Heart disease Paternal Grandmother    Colon cancer Neg Hx    Esophageal cancer Neg Hx    Rectal cancer Neg Hx    Stomach cancer Neg Hx    Sleep apnea Neg Hx     Past Medical History:  Diagnosis Date   Allergy    seasonal   Anemia    Anxiety    Arthritis    Diabetes mellitus without complication (HCC)    GERD (gastroesophageal reflux disease)    Mononucleosis    Seasonal allergies    Sleep apnea    has not gotten c-pap yet   Viral hepatitis 10/2016    Past Surgical History:  Procedure Laterality Date   CARPAL TUNNEL RELEASE Left 12/05/2012   Procedure: CARPAL TUNNEL RELEASE;  Surgeon: Sanjuana Kava, MD;  Location: [facility];  Service: Orthopedics;  Laterality: Left;   COLONOSCOPY     TUBAL LIGATION  2004     Current Outpatient Medications on File Prior to Visit  Medication Sig Dispense Refill   atorvastatin (LIPITOR) 40 MG tablet TAKE ONE TABLET BY MOUTH AT BEDTIME 90 tablet 0   Blood Glucose Monitoring Suppl (BLOOD GLUCOSE MONITOR SYSTEM) w/Device KIT 1 Device 2 (two) times daily by Does not apply route. 1 each 0   fexofenadine (ALLEGRA) 180 MG tablet Take 180 mg by mouth daily.     glucose blood test strip Use as instructed 100 each 12   ibuprofen (ADVIL,MOTRIN) 200 MG tablet Take 200 mg by mouth as needed for pain.     Lancets MISC Use to test blood sugar twice daily. 100 each 3   medroxyPROGESTERone (PROVERA) 10 MG tablet      tirzepatide (MOUNJARO) 15 MG/0.5ML Pen Inject 15 mg into the skin once a week. 6 mL PRN   venlafaxine XR (EFFEXOR-XR) 75 MG 24 hr capsule Take 150 mg by mouth.      vitamin  C (ASCORBIC ACID) 500 MG tablet Take 500 mg by mouth daily.     No current facility-administered medications on file prior to visit.   Physical exam:  Today's Vitals   01/07/22 1319  BP: (!) 134/90  Pulse:  66  Weight: 220 lb (99.8 kg)  Height: 5' 4"  (1.626 m)   Body mass index is 37.76 kg/m.   Wt Readings from Last 3 Encounters:  01/07/22 220 lb (99.8 kg)  11/17/21 221 lb 6.4 oz (100.4 kg)  07/15/21 222 lb 9.6 oz (101 kg)     Ht Readings from Last 3 Encounters:  01/07/22 5' 4"  (1.626 m)  11/17/21 5' 4"  (1.626 m)  07/15/21 5' 4"  (1.626 m)      General: The patient is awake, alert and appears not in acute distress. The patient is well groomed. Head: Normocephalic, atraumatic. Neck is supple. Mallampati 1- fully elevated ,  neck circumference: now 16 inches . Nasal airflow patent.   Retrognathia is not seen, but lower jaw is small, oral cavity small. .  Dental status: crowded.  Cardiovascular:  Regular rate and cardiac rhythm by pulse,  without distended neck veins. Respiratory: Lungs are clear to auscultation.  Skin:  Without evidence of ankle edema, or rash. Trunk: The patient's posture is erect.   Neurologic exam : The patient is awake and alert, oriented to place and time.   Memory subjective described as intact.  Attention span & concentration ability appears normal.  Speech is fluent, but affected by hearing -  without dysarthria, dysphonia or aphasia.  Mood and affect are appropriate.   Cranial nerves: no loss of smell or taste reported  Pupils are equal and briskly reactive to light. Funduscopic exam deferred.  Extraocular movements in vertical and horizontal planes were intact and without nystagmus. No Diplopia. Visual fields by finger perimetry are intact. Hearing loss, left ear deafness.    Facial sensation intact to fine touch.  Facial motor strength is symmetric and tongue and uvula move midline.  Neck ROM : rotation, tilt and flexion extension were normal for age and shoulder shrug was symmetrical.    Motor exam:  Symmetric bulk, tone and ROM.   Normal tone without cog- wheeling, symmetric grip strength . Right hand carpal tunnel comes and goes.    Sensory:   deferred   Coordination: deferred.   Gait and station: Patient could rise unassisted from a seated position, walked without assistive device.  Stance is of normal width/ base. Deep tendon reflexes: in the  upper and lower extremities are symmetric and intact.        After spending a total time of 25 minutes face to face and additional time for physical and neurologic examination, review of laboratory studies,  personal review of imaging studies, reports and results of other testing and review of referral information / records as far as provided in visit, I have established the following assessments:  1) Mrs Wallner had several risk factors for the presence of sleep apnea  her risk factors were modified by weight loss and she reports that CPAP helped to achieve that too. S her sleep is now more refreshing, daytime alertness is improved, and she has less nocturia, no snoring, and less ache and pains.    My Plan is to proceed with:  1)continue with CPAP at current settings but apply the machine daily and for a 4 minimum.  She had a family emergency and didn't think to take her machine with her out of  town. Rv in 12 months .   I would like to thank  Halford Chessman [place],   [id number] for allowing me to meet with and to take care of this pleasant patient.     I plan to follow up either personally or through our NP within 2-4  month.   CC: I will share my notes with PCP   Electronically signed by: Larey Seat, MD 01/07/2022 1:52 PM  [facility] Board certified by The [employer] of Sleep Medicine and Diplomate of the [employer] of Sleep Medicine. Board certified In Neurology through the No Name, Fellow of the [employer] of Neurology. Medical Director of [facility].

## 2022-04-05 ENCOUNTER — Ambulatory Visit: Payer: 59 | Admitting: Family Medicine

## 2022-04-14 ENCOUNTER — Telehealth: Payer: 59 | Admitting: Family Medicine

## 2022-04-14 ENCOUNTER — Encounter: Payer: Self-pay | Admitting: Family Medicine

## 2022-04-14 DIAGNOSIS — U071 COVID-19: Secondary | ICD-10-CM | POA: Diagnosis not present

## 2022-04-14 NOTE — Progress Notes (Signed)
Subjective:  Patient ID: Mary [name], female    DOB: 1965-06-28  Age: 57 y.o. MRN: [id number]  CC: No chief complaint on file.   HPI DIYANA STARRETT presents for positive home Covid test this AM. Onset with congestion, rhino and sneezing 2 days ago. Had fever 1st night. Nausea too. Now she is getting better. Still feeling stuffy. Occ. Cough. No fever or chills for the last 2 days.      11/17/2021    8:17 AM 07/15/2021    8:25 AM 07/15/2021    8:24 AM  Depression screen PHQ 2/9  Decreased Interest 0 0 0  Down, Depressed, Hopeless 0 0 0  PHQ - 2 Score 0 0 0  Altered sleeping  0   Tired, decreased energy  0   Change in appetite  0   Feeling bad or failure about yourself   0   Trouble concentrating  0   Moving slowly or fidgety/restless  0   Suicidal thoughts  0   PHQ-9 Score  0   Difficult doing work/chores  Not difficult at all     History Faduma has a past medical history of Allergy, Anemia, Anxiety, Arthritis, Diabetes mellitus without complication ([place]), GERD (gastroesophageal reflux disease), Mononucleosis, Seasonal allergies, Sleep apnea, and Viral hepatitis (10/2016).   She has a past surgical history that includes Tubal ligation (2004); Carpal tunnel release (Left, 12/05/2012); and Colonoscopy.   Her family history includes Breast cancer in her sister; Cancer (age of onset: 28) in her mother; Colon polyps in her sister; Diabetes in her brother; Heart disease in her paternal grandmother.She reports that she has never smoked. She has never used smokeless tobacco. She reports current alcohol use. She reports that she does not use drugs.    ROS Review of Systems  Constitutional:  Negative for appetite change, chills, diaphoresis, fatigue and fever.  HENT:  Negative for congestion, ear pain, hearing loss, postnasal drip, rhinorrhea, sore throat and trouble swallowing.   Respiratory:  Negative for cough, chest tightness and shortness of breath.   Cardiovascular:  Negative for  chest pain and palpitations.  Gastrointestinal:  Negative for abdominal pain.  Musculoskeletal:  Negative for arthralgias.  Skin:  Negative for rash.    Objective:  There were no vitals taken for this visit.  BP Readings from Last 3 Encounters:  01/07/22 (!) 134/90  11/17/21 94/63  07/15/21 109/74    Wt Readings from Last 3 Encounters:  01/07/22 220 lb (99.8 kg)  11/17/21 221 lb 6.4 oz (100.4 kg)  07/15/21 222 lb 9.6 oz (101 kg)     Physical Exam Constitutional:      General: She is not in acute distress.    Appearance: Normal appearance. She is not ill-appearing.  HENT:     Head: Normocephalic and atraumatic.     Nose: Congestion present.  Musculoskeletal:     Cervical back: Normal range of motion.  Neurological:     Mental Status: She is alert and oriented to person, place, and time.     Cranial Nerves: No cranial nerve deficit.  Psychiatric:        Mood and Affect: Mood normal.        Thought Content: Thought content normal.       Assessment & Plan:   Diagnoses and all orders for this visit:  COVID-19 virus infection   We reviewed the symptoms to look for regarding worsening of her infection.  For now she should just rest and  keep herself hydrated.  Due to work protocol she will be out for 5 days and that seems appropriate that she be 5 days after her first positive test.    I am having [name] maintain her ibuprofen, Blood Glucose Monitor System, Lancets, glucose blood, venlafaxine XR, fexofenadine, medroxyPROGESTERone, ascorbic acid, tirzepatide, and atorvastatin.  Allergies as of 04/14/2022   No Known Allergies      Medication List        Accurate as of April 14, 2022  5:47 PM. If you have any questions, ask your nurse or doctor.          ascorbic acid 500 MG tablet Commonly known as: VITAMIN C Take 500 mg by mouth daily.   atorvastatin 40 MG tablet Commonly known as: LIPITOR TAKE ONE TABLET BY MOUTH AT BEDTIME   Blood  Glucose Monitor System w/Device Kit 1 Device 2 (two) times daily by Does not apply route.   fexofenadine 180 MG tablet Commonly known as: ALLEGRA Take 180 mg by mouth daily.   glucose blood test strip Use as instructed   ibuprofen 200 MG tablet Commonly known as: ADVIL Take 200 mg by mouth as needed for pain.   Lancets Misc Use to test blood sugar twice daily.   medroxyPROGESTERone 10 MG tablet Commonly known as: PROVERA   tirzepatide 15 MG/0.5ML Pen Commonly known as: MOUNJARO Inject 15 mg into the skin once a week.   venlafaxine XR 75 MG 24 hr capsule Commonly known as: EFFEXOR-XR Take 150 mg by mouth.       Video visit  I discussed the limitations, risks, security and privacy concerns of performing an evaluation and management service by video and the availability of in person appointments. I also discussed with the patient that there may be a patient responsible charge related to this service. The patient expressed understanding and agreed to proceed. Pt. Is at home. Dr. Livia Snellen is in his office.  Follow Up Instructions:   I discussed the assessment and treatment plan with the patient. The patient was provided an opportunity to ask questions and all were answered. The patient agreed with the plan and demonstrated an understanding of the instructions.   The patient was advised to call back or seek an in-person evaluation if the symptoms worsen or if the condition fails to improve as anticipated. Total minutes  contact time: 13   Follow-up: Return if symptoms worsen or fail to improve.  Claretta Fraise, M.D.

## 2022-04-28 ENCOUNTER — Telehealth: Payer: Self-pay | Admitting: Family Medicine

## 2022-04-28 NOTE — Telephone Encounter (Signed)
Left message for patient to call [facility] to get surgery clearance appointment scheduled.   Surgery Clearance form is being held at medical records box until appointment is scheduled.

## 2022-05-18 ENCOUNTER — Ambulatory Visit: Payer: 59 | Admitting: Family Medicine

## 2022-05-18 ENCOUNTER — Encounter: Payer: Self-pay | Admitting: Family Medicine

## 2022-05-18 VITALS — BP 112/75 | HR 76 | Temp 98.7°F | Ht 64.0 in | Wt 217.0 lb

## 2022-05-18 DIAGNOSIS — E1169 Type 2 diabetes mellitus with other specified complication: Secondary | ICD-10-CM

## 2022-05-18 DIAGNOSIS — E785 Hyperlipidemia, unspecified: Secondary | ICD-10-CM | POA: Diagnosis not present

## 2022-05-18 DIAGNOSIS — Z01818 Encounter for other preprocedural examination: Secondary | ICD-10-CM

## 2022-05-18 LAB — CMP14+EGFR
ALT: 13 IU/L (ref 0–32)
AST: 14 IU/L (ref 0–40)
Albumin/Globulin Ratio: 1.7 (ref 1.2–2.2)
Albumin: 4.3 g/dL (ref 3.8–4.9)
Alkaline Phosphatase: 96 IU/L (ref 44–121)
BUN/Creatinine Ratio: 17 (ref 9–23)
BUN: 14 mg/dL (ref 6–24)
Bilirubin Total: 0.4 mg/dL (ref 0.0–1.2)
CO2: 25 mmol/L (ref 20–29)
Calcium: 9.4 mg/dL (ref 8.7–10.2)
Chloride: 102 mmol/L (ref 96–106)
Creatinine, Ser: 0.82 mg/dL (ref 0.57–1.00)
Globulin, Total: 2.5 g/dL (ref 1.5–4.5)
Glucose: 98 mg/dL (ref 70–99)
Potassium: 4.9 mmol/L (ref 3.5–5.2)
Sodium: 140 mmol/L (ref 134–144)
Total Protein: 6.8 g/dL (ref 6.0–8.5)
eGFR: 84 mL/min/{1.73_m2} (ref >59)

## 2022-05-18 LAB — COAGUCHEK XS/INR WAIVED
INR: 1 (ref 0.9–1.1)
Prothrombin Time: 12.1 s

## 2022-05-18 LAB — CBC
Hematocrit: 42 % (ref 34.0–46.6)
Hemoglobin: 13.7 g/dL (ref 11.1–15.9)
MCH: 28.5 pg (ref 26.6–33.0)
MCHC: 32.6 g/dL (ref 31.5–35.7)
MCV: 88 fL (ref 79–97)
Platelets: 246 10*3/uL (ref 150–450)
RBC: 4.8 x10E6/uL (ref 3.77–5.28)
RDW: 13.6 % (ref 11.7–15.4)
WBC: 6.8 10*3/uL (ref 3.4–10.8)

## 2022-05-18 LAB — LIPID PANEL
Chol/HDL Ratio: 3.8 ratio (ref 0.0–4.4)
Cholesterol, Total: 220 mg/dL — ABNORMAL HIGH (ref 100–199)
HDL: 58 mg/dL (ref >39)
LDL Chol Calc (NIH): 146 mg/dL — ABNORMAL HIGH (ref 0–99)
Triglycerides: 93 mg/dL (ref 0–149)
VLDL Cholesterol Cal: 16 mg/dL (ref 5–40)

## 2022-05-18 LAB — BAYER DCA HB A1C WAIVED: HB A1C (BAYER DCA - WAIVED): 5.7 % — ABNORMAL HIGH (ref 4.8–5.6)

## 2022-05-18 NOTE — Progress Notes (Signed)
Subjective: CC:DM PCP: [name], DO [id number] Mary [name] is a 57 y.o. female presenting to clinic today for:  1. Type 2 Diabetes with hyperlipidemia associated with morbid obesity:  She reports compliance with Mounjaro.  Is prescribed Lipitor as well.  She denies any nausea, vomiting, abdominal pain, numbness, tingling, foot ulcers.  No visual disturbance.  She is riding a recumbent bike currently because she is due for a rotator cuff repair on the left soon.  Needs preop clearance today if able.  Last eye exam: needs Last foot exam: UTD Last A1c:  Lab Results  Component Value Date   HGBA1C 5.2 11/17/2021   Nephropathy screen indicated?: UTD Last flu, zoster and/or pneumovax:  Immunization History  Administered Date(s) Administered   Influenza,inj,Quad PF,6+ Mos 12/04/2018   Influenza-Unspecified 12/07/2017   Moderna Sars-Covid-2 Vaccination 05/30/2019, 07/05/2019, 01/25/2020   Pneumococcal Polysaccharide-23 03/11/2020   Tdap 08/30/2017   Zoster Recombinat (Shingrix) 07/15/2021, 11/17/2021    2. Left rotator cuff pain She has had previous carpal tunnel surgery and this was not complicated.  She denies any difficulty with general anesthesia.  No postop nausea or vomiting.  No difficulty arousing from anesthesia.  She has not had any issues with healing nor any postop infections.  She will be getting a left-sided rotator cuff repair with Dr. Alma Friendly at [facility].  This is tentatively scheduled for April but they have not made a solid plan yet because she needed the preop clearance first.  She denies any chest pain, shortness of breath, difficulty extending neck.  1) High Risk Cardiac Conditions  1) Recent MI - No.  2) Decompensated Heart Failure - No.  3) Unstable angina - No.  4) Symptomatic arrythmia - No.  5) Sx Valvular Disease - No.  2) Intermediate Risk Factors - DM  - Yes.    2) Functional Status - > 4 mets (Walk, run, climb stairs) Yes.     3) Surgery  Specific Risk: Intermediate (Orthopaedic )            4) Further Noninvasive evaluation -   1) EKG - Yes.     1) Hx of DM  2) Echo - No.   1) Worsening dyspnea   3) Stress Testing - Active Cardiac Disease - No.  5) Need for medical therapy - Beta Blocker, Statins indicated ? No.  ROS: Per HPI  No Known Allergies Past Medical History:  Diagnosis Date   Allergy    seasonal   Anemia    Anxiety    Arthritis    Diabetes mellitus without complication (HCC)    GERD (gastroesophageal reflux disease)    Mononucleosis    Seasonal allergies    Sleep apnea    has not gotten c-pap yet   Viral hepatitis 10/2016    Current Outpatient Medications:    atorvastatin (LIPITOR) 40 MG tablet, TAKE ONE TABLET BY MOUTH AT BEDTIME, Disp: 90 tablet, Rfl: 0   Blood Glucose Monitoring Suppl (BLOOD GLUCOSE MONITOR SYSTEM) w/Device KIT, 1 Device 2 (two) times daily by Does not apply route., Disp: 1 each, Rfl: 0   fexofenadine (ALLEGRA) 180 MG tablet, Take 180 mg by mouth daily., Disp: , Rfl:    glucose blood test strip, Use as instructed, Disp: 100 each, Rfl: 12   ibuprofen (ADVIL,MOTRIN) 200 MG tablet, Take 200 mg by mouth as needed for pain., Disp: , Rfl:    Lancets MISC, Use to test blood sugar twice daily., Disp: 100 each, Rfl: 3  medroxyPROGESTERone (PROVERA) 10 MG tablet, , Disp: , Rfl:    tirzepatide (MOUNJARO) 15 MG/0.5ML Pen, Inject 15 mg into the skin once a week., Disp: 6 mL, Rfl: PRN   venlafaxine XR (EFFEXOR-XR) 75 MG 24 hr capsule, Take 150 mg by mouth. , Disp: , Rfl:    vitamin C (ASCORBIC ACID) 500 MG tablet, Take 500 mg by mouth daily., Disp: , Rfl:  Social History   Socioeconomic History   Marital status: Married    Spouse name: Not on file   Number of children: Not on file   Years of education: Not on file   Highest education level: Not on file  Occupational History   Not on file  Tobacco Use   Smoking status: Never   Smokeless tobacco: Never  Vaping Use   Vaping Use:  Never used  Substance and Sexual Activity   Alcohol use: Yes    Comment: occasional   Drug use: No   Sexual activity: Yes  Other Topics Concern   Not on file  Social History Narrative   Not on file   Social Determinants of Health   Financial Resource Strain: Not on file  Food Insecurity: Not on file  Transportation Needs: Not on file  Physical Activity: Not on file  Stress: Not on file  Social Connections: Not on file  Intimate Partner Violence: Not on file   Family History  Problem Relation Age of Onset   Cancer Mother 37       colon   Breast cancer Sister    Colon polyps Sister    Diabetes Brother    Heart disease Paternal Grandmother    Colon cancer Neg Hx    Esophageal cancer Neg Hx    Rectal cancer Neg Hx    Stomach cancer Neg Hx    Sleep apnea Neg Hx     Objective: Office vital signs reviewed. BP 112/75   Pulse 76   Temp 98.7 F (37.1 C)   Ht '5\' 4"'$  (1.626 m)   Wt 217 lb (98.4 kg)   LMP 06/16/2020   SpO2 99%   BMI 37.25 kg/m   Physical Examination:  General: Awake, alert, well nourished, No acute distress HEENT: sclera white, MMM Mallampati Teoh Cardio: regular rate and rhythm, S1S2 heard, no murmurs appreciated Pulm: clear to auscultation bilaterally, no wheezes, rhonchi or rales; normal work of breathing on room air GI: soft, non-tender, non-distended, bowel sounds present x4, no hepatomegaly, no splenomegaly, no masses Extremities: warm, well perfused, No edema, cyanosis or clubbing; +2 pulses bilaterally MSK: normal gait and station.  Decreased range of motion in the left upper extremity with abduction.  Able to fully extend C-spine without difficulty  Assessment/ Plan: 57 y.o. female   Type 2 diabetes mellitus with other specified complication, without long-term current use of insulin ([place]) - Plan: Bayer DCA Hb A1c Waived, Microalbumin / creatinine urine ratio, CMP14+EGFR, EKG 12-Lead  Hyperlipidemia associated with type 2 diabetes mellitus  ([place]) - Plan: Lipid Panel, CMP14+EGFR, EKG 12-Lead  Morbid obesity ([place]) - Plan: CMP14+EGFR, EKG 12-Lead  Preop examination - Plan: EKG 12-Lead, CBC, CoaguChek XS/INR Waived  Sugar remains under excellent control.  Check urine microalbumin.  EKG ordered today which demonstrated no acute ischemia.  I did advise her to hold her Mounjaro for 2 weeks prior to surgery.  It sounds like she is planned to have a nerve block but just in case she needs general anesthesia I do not want her wrist aspiration etc.  She is not on any NSAIDs or other blood thinners/antiplatelets.  Fasting lipid collected.  Liver function test.  Needs to resume statin  She continues to lose weight through lifestyle modification.  I have independently evaluated patient.  Mary [name] is a 57 y.o. female who is low risk for a intermediate risk surgery.  There are not modifiable risk factors (smoking, etc). Jehilyn Delahoya Gust's RCRI calculation for MACE is: 0.  Will fax form over to [facility] with her lab results once available   [name], [place] [phone]

## 2022-05-19 ENCOUNTER — Other Ambulatory Visit: Payer: Self-pay | Admitting: Family Medicine

## 2022-05-19 DIAGNOSIS — E1169 Type 2 diabetes mellitus with other specified complication: Secondary | ICD-10-CM

## 2022-05-19 LAB — MICROALBUMIN / CREATININE URINE RATIO
Creatinine, Urine: 88.4 mg/dL
Microalb/Creat Ratio: <3 mg/g creat (ref 0–29)
Microalbumin, Urine: <3 ug/mL

## 2022-05-19 MED ORDER — ATORVASTATIN CALCIUM 40 MG PO TABS: 40.0000 mg | ORAL_TABLET | Freq: Every day | ORAL | 3 refills | Status: DC

## 2022-05-25 ENCOUNTER — Ambulatory Visit: Payer: 59

## 2022-06-29 ENCOUNTER — Ambulatory Visit: Payer: 59

## 2022-07-21 ENCOUNTER — Other Ambulatory Visit: Payer: Self-pay | Admitting: Obstetrics and Gynecology

## 2022-07-21 DIAGNOSIS — Z1231 Encounter for screening mammogram for malignant neoplasm of breast: Secondary | ICD-10-CM

## 2022-07-22 ENCOUNTER — Other Ambulatory Visit: Payer: Self-pay | Admitting: Family Medicine

## 2022-07-22 DIAGNOSIS — E1169 Type 2 diabetes mellitus with other specified complication: Secondary | ICD-10-CM

## 2022-10-18 ENCOUNTER — Ambulatory Visit
Admission: RE | Admit: 2022-10-18 | Discharge: 2022-10-18 | Disposition: A | Discharge status: Home / Self Care | Payer: 59 | Source: Ambulatory Visit | Attending: Obstetrics and Gynecology | Admitting: Obstetrics and Gynecology

## 2022-10-18 DIAGNOSIS — Z1231 Encounter for screening mammogram for malignant neoplasm of breast: Secondary | ICD-10-CM

## 2022-11-22 ENCOUNTER — Ambulatory Visit: Payer: 59 | Admitting: Family Medicine

## 2022-11-22 ENCOUNTER — Encounter: Payer: Self-pay | Admitting: Family Medicine

## 2022-11-22 VITALS — BP 113/79 | HR 72 | Temp 98.3°F | Ht 64.0 in | Wt 215.0 lb

## 2022-11-22 DIAGNOSIS — Z7985 Long-term (current) use of injectable non-insulin antidiabetic drugs: Secondary | ICD-10-CM | POA: Diagnosis not present

## 2022-11-22 DIAGNOSIS — E1169 Type 2 diabetes mellitus with other specified complication: Secondary | ICD-10-CM | POA: Diagnosis not present

## 2022-11-22 DIAGNOSIS — B3731 Acute candidiasis of vulva and vagina: Secondary | ICD-10-CM

## 2022-11-22 DIAGNOSIS — E785 Hyperlipidemia, unspecified: Secondary | ICD-10-CM

## 2022-11-22 DIAGNOSIS — M545 Low back pain, unspecified: Secondary | ICD-10-CM | POA: Diagnosis not present

## 2022-11-22 LAB — MICROSCOPIC EXAMINATION
RBC, Urine: NONE SEEN /HPF (ref 0–2)
Renal Epithel, UA: NONE SEEN /HPF

## 2022-11-22 LAB — URINALYSIS, ROUTINE W REFLEX MICROSCOPIC
Bilirubin, UA: NEGATIVE
Glucose, UA: NEGATIVE
Nitrite, UA: NEGATIVE
Protein,UA: NEGATIVE
Specific Gravity, UA: >1.03 — ABNORMAL HIGH (ref 1.005–1.030)
Urobilinogen, Ur: 0.2 mg/dL (ref 0.2–1.0)
pH, UA: 5.5 (ref 5.0–7.5)

## 2022-11-22 LAB — LIPID PANEL
Chol/HDL Ratio: 2.3 ratio (ref 0.0–4.4)
Cholesterol, Total: 123 mg/dL (ref 100–199)
HDL: 54 mg/dL (ref >39)
LDL Chol Calc (NIH): 49 mg/dL (ref 0–99)
Triglycerides: 107 mg/dL (ref 0–149)
VLDL Cholesterol Cal: 20 mg/dL (ref 5–40)

## 2022-11-22 LAB — BAYER DCA HB A1C WAIVED: HB A1C (BAYER DCA - WAIVED): 5.3 % (ref 4.8–5.6)

## 2022-11-22 LAB — HM DIABETES EYE EXAM

## 2022-11-22 MED ORDER — FLUCONAZOLE 150 MG PO TABS
150.0000 mg | ORAL_TABLET | Freq: Once | ORAL | 0 refills | Status: AC
Start: 2022-11-22 — End: 2022-11-22

## 2022-11-22 NOTE — Progress Notes (Signed)
Subjective: CC:DM PCP: Raliegh Ip, DO ZOX:[id number] Mary AYSON is a 57 y.o. female presenting to clinic today for:  1. Type 2 Diabetes with hyperlipidemia:  She reports, plans with her Mounjaro 15 mg weekly, Lipitor 40 mg daily.  No hypoglycemic episodes.  Had cholesterol panel done at work and this was very well-controlled.  Has diabetic eye exam scheduled today  Diabetes Health Maintenance Due  Topic Date Due   OPHTHALMOLOGY EXAM  06/17/2022   HEMOGLOBIN A1C  05/22/2023   FOOT EXAM  11/22/2023    Last A1c:  Lab Results  Component Value Date   HGBA1C 5.7 (H) 05/18/2022    ROS: No chest pain, shortness of breath, dizziness, dysuria or hematuria.  She does report some low back pain and wonders if perhaps she is trying to make a kidney stone as this runs in her family.  Had pelvic exam performed last month by her OB/GYN.  ROS: Per HPI  No Known Allergies Past Medical History:  Diagnosis Date   Allergy    seasonal   Anemia    Anxiety    Arthritis    Diabetes mellitus without complication (HCC)    GERD (gastroesophageal reflux disease)    Mononucleosis    Seasonal allergies    Sleep apnea    has not gotten c-pap yet   Viral hepatitis 10/2016    Current Outpatient Medications:    atorvastatin (LIPITOR) 40 MG tablet, Take 1 tablet (40 mg total) by mouth at bedtime., Disp: 90 tablet, Rfl: 3   Blood Glucose Monitoring Suppl (BLOOD GLUCOSE MONITOR SYSTEM) w/Device KIT, 1 Device 2 (two) times daily by Does not apply route., Disp: 1 each, Rfl: 0   fexofenadine (ALLEGRA) 180 MG tablet, Take 180 mg by mouth daily., Disp: , Rfl:    glucose blood test strip, Use as instructed, Disp: 100 each, Rfl: 12   ibuprofen (ADVIL,MOTRIN) 200 MG tablet, Take 200 mg by mouth as needed for pain., Disp: , Rfl:    Lancets MISC, Use to test blood sugar twice daily., Disp: 100 each, Rfl: 3   medroxyPROGESTERone (PROVERA) 10 MG tablet, , Disp: , Rfl:    tirzepatide (MOUNJARO) 15 MG/0.5ML Pen,  INJECT 15MG  INTO THE SKIN ONCE A WEEK, Disp: 6 mL, Rfl: 1   venlafaxine XR (EFFEXOR-XR) 75 MG 24 hr capsule, Take 150 mg by mouth. , Disp: , Rfl:    vitamin C (ASCORBIC ACID) 500 MG tablet, Take 500 mg by mouth daily., Disp: , Rfl:  Social History   Socioeconomic History   Marital status: Married    Spouse name: Not on file   Number of children: Not on file   Years of education: Not on file   Highest education level: Not on file  Occupational History   Not on file  Tobacco Use   Smoking status: Never   Smokeless tobacco: Never  Vaping Use   Vaping status: Never Used  Substance and Sexual Activity   Alcohol use: Yes    Comment: occasional   Drug use: No   Sexual activity: Yes  Other Topics Concern   Not on file  Social History Narrative   Not on file   Social Determinants of Health   Financial Resource Strain: Not on file  Food Insecurity: Not on file  Transportation Needs: Not on file  Physical Activity: Not on file  Stress: Not on file  Social Connections: Not on file  Intimate Partner Violence: Not on file   Family History  Problem Relation Age of Onset   Cancer Mother 34       colon   Breast cancer Sister    Colon polyps Sister    Diabetes Brother    Heart disease Paternal Grandmother    Colon cancer Neg Hx    Esophageal cancer Neg Hx    Rectal cancer Neg Hx    Stomach cancer Neg Hx    Sleep apnea Neg Hx     Objective: Office vital signs reviewed. BP 113/79   Pulse 72   Temp 98.3 F (36.8 C)   Ht 5\' 4"  (1.626 m)   Wt 215 lb (97.5 kg)   LMP 06/16/2020   SpO2 99%   BMI 36.90 kg/m   Physical Examination:  General: Awake, alert, obese, well-appearing female, No acute distress HEENT: Sclera white.  Moist mucous membranes Cardio: regular rate and rhythm, S1S2 heard, no murmurs appreciated Pulm: clear to auscultation bilaterally, no wheezes, rhonchi or rales; normal work of breathing on room air  Diabetic Foot Exam - Simple   Simple Foot  Form Diabetic Foot exam was performed with the following findings: Yes 11/22/2022  8:38 AM  Visual Inspection No deformities, no ulcerations, no other skin breakdown bilaterally: Yes Sensation Testing Intact to touch and monofilament testing bilaterally: Yes Pulse Check Posterior Tibialis and Dorsalis pulse intact bilaterally: Yes Comments      Assessment/ Plan: 57 y.o. female   Type 2 diabetes mellitus with other specified complication, without long-term current use of insulin (HCC) - Plan: Bayer DCA Hb A1c Waived  Long-term current use of injectable noninsulin antidiabetic medication  Hyperlipidemia associated with type 2 diabetes mellitus (HCC) - Plan: Lipid Panel  Morbid obesity (HCC)  Acute bilateral low back pain without sciatica - Plan: Urinalysis, Routine w reflex microscopic, Urine Culture  Yeast vaginitis - Plan: fluconazole (DIFLUCAN) 150 MG tablet   Sugar is very well-controlled with A1c of 5.3.  Encouraged her to have her eye doctor to send [place] the retinal exam once completed.  Diabetic foot exam normal.  Fasting lipid panel collected today anticipate good levels given reports of control in July  Continue to work on lifestyle modification to reduce weight  Urinalysis did not demonstrate any evidence of sediment to suggest renal stone or evidence of UTI.  Does have yeast in her urine specimen so Diflucan sent  May follow-up in 6 months for annual physical with fasting labs, repeat A1c etc.   Raliegh Ip, DO [facility] [phone]

## 2022-11-24 LAB — URINE CULTURE

## 2023-01-12 ENCOUNTER — Ambulatory Visit: Payer: 59 | Admitting: Adult Health

## 2023-01-19 ENCOUNTER — Other Ambulatory Visit: Payer: Self-pay | Admitting: Family Medicine

## 2023-01-19 DIAGNOSIS — E1169 Type 2 diabetes mellitus with other specified complication: Secondary | ICD-10-CM

## 2023-02-22 ENCOUNTER — Telehealth: Payer: Self-pay | Admitting: Family Medicine

## 2023-03-21 ENCOUNTER — Telehealth: Payer: Self-pay

## 2023-03-21 ENCOUNTER — Other Ambulatory Visit (HOSPITAL_COMMUNITY): Payer: Self-pay

## 2023-03-21 NOTE — Telephone Encounter (Signed)
 Pharmacy Patient Advocate Encounter   Received notification from CoverMyMeds that prior authorization for Mounjaro  is required/requested.   Insurance verification completed.   The patient is insured through [facility] .   Per test claim: PA required; PA submitted to above mentioned insurance via CoverMyMeds Key/confirmation #/EOC (Key: [id number])    Status is pending

## 2023-03-22 ENCOUNTER — Other Ambulatory Visit (HOSPITAL_COMMUNITY): Payer: Self-pay

## 2023-03-22 NOTE — Telephone Encounter (Signed)
 Copied from CRM [id number]. Topic: Clinical - Prescription Issue >> Mar 22, 2023 11:27 AM Graeme ORN wrote: Reason for CRM: Patient called wanted to let us  know that her insurance and and pharmacy require prior authorization to fill MOUNJARO  15 MG/0.5ML Pen which she needs. She wanted to know if fax was received and if authorization  is in process. Thank You

## 2023-03-22 NOTE — Telephone Encounter (Signed)
 Pharmacy Patient Advocate Encounter  Received notification from [employer] that Prior Authorization for Mounjaro  15MG /0.5ML auto-injectors has been APPROVED from 03/22/23 to 03/20/24. Ran test claim, Copay is $25. This test claim was processed through [facility]- copay amounts may vary at other pharmacies due to pharmacy/plan contracts, or as the patient moves through the different stages of their insurance plan.   PA #/Case ID/Reference #:  [id number]

## 2023-03-22 NOTE — Telephone Encounter (Signed)
 Patient aware and verbalized understanding.

## 2023-03-30 ENCOUNTER — Other Ambulatory Visit: Payer: Self-pay | Admitting: Family Medicine

## 2023-03-30 DIAGNOSIS — E1169 Type 2 diabetes mellitus with other specified complication: Secondary | ICD-10-CM

## 2023-05-11 ENCOUNTER — Ambulatory Visit: Payer: 59 | Admitting: Adult Health

## 2023-05-27 NOTE — Progress Notes (Deleted)
 Mary [name] is a 58 y.o. female presents to office today for annual physical exam examination.    Concerns today include: 1. Type 2 Diabetes with hypertension, hyperlipidemia:  Glucometer:***.   High at home: ***; Low at home: ***, Taking medication(s): ***,.  Last eye exam: *** Last foot exam: *** Last A1c:  Lab Results  Component Value Date   HGBA1C 5.3 11/22/2022   Nephropathy screen indicated?: *** Last flu, zoster and/or pneumovax:  Immunization History  Administered Date(s) Administered   Influenza,inj,Quad PF,6+ Mos 12/04/2018   Influenza-Unspecified 12/07/2017   Moderna Sars-Covid-2 Vaccination 05/30/2019, 07/05/2019, 01/25/2020   Pneumococcal Polysaccharide-23 03/11/2020   Tdap 08/30/2017   Zoster Recombinant(Shingrix) 07/15/2021, 11/17/2021    ROS: ***dizziness, LOC, polyuria, polydipsia, unintended weight loss/gain, foot ulcerations, numbness or tingling in extremities, shortness of breath or chest pain.   Occupation: ***, Marital status: ***, Substance use: *** Health Maintenance Due  Topic Date Due   Pneumococcal Vaccine 59-47 Years old (2 of 2 - PCV) 03/11/2021   OPHTHALMOLOGY EXAM  06/17/2022   COVID-19 Vaccine (4 - 2024-25 season) 11/14/2022   Diabetic kidney evaluation - eGFR measurement  05/18/2023   Diabetic kidney evaluation - Urine ACR  05/18/2023   HEMOGLOBIN A1C  05/22/2023   Refills needed today: ***  Immunization History  Administered Date(s) Administered   Influenza,inj,Quad PF,6+ Mos 12/04/2018   Influenza-Unspecified 12/07/2017   Moderna Sars-Covid-2 Vaccination 05/30/2019, 07/05/2019, 01/25/2020   Pneumococcal Polysaccharide-23 03/11/2020   Tdap 08/30/2017   Zoster Recombinant(Shingrix) 07/15/2021, 11/17/2021   Past Medical History:  Diagnosis Date   Allergy    seasonal   Anemia    Anxiety    Arthritis    Diabetes mellitus without complication (HCC)    GERD (gastroesophageal reflux disease)    Mononucleosis    Seasonal  allergies    Sleep apnea    has not gotten c-pap yet   Viral hepatitis 10/2016   Social History   Socioeconomic History   Marital status: Married    Spouse name: Not on file   Number of children: Not on file   Years of education: Not on file   Highest education level: Not on file  Occupational History   Not on file  Tobacco Use   Smoking status: Never   Smokeless tobacco: Never  Vaping Use   Vaping status: Never Used  Substance and Sexual Activity   Alcohol use: Yes    Comment: occasional   Drug use: No   Sexual activity: Yes  Other Topics Concern   Not on file  Social History Narrative   Not on file   Social Drivers of Health   Financial Resource Strain: Not on file  Food Insecurity: Not on file  Transportation Needs: Not on file  Physical Activity: Not on file  Stress: Not on file  Social Connections: Not on file  Intimate Partner Violence: Not on file   Past Surgical History:  Procedure Laterality Date   CARPAL TUNNEL RELEASE Left 12/05/2012   Procedure: CARPAL TUNNEL RELEASE;  Surgeon: Darreld Mclean, MD;  Location: [facility];  Service: Orthopedics;  Laterality: Left;   COLONOSCOPY     TUBAL LIGATION  2004   Family History  Problem Relation Age of Onset   Cancer Mother 87       colon   Breast cancer Sister    Colon polyps Sister    Diabetes Brother    Heart disease Paternal Grandmother    Colon cancer Neg Hx  Esophageal cancer Neg Hx    Rectal cancer Neg Hx    Stomach cancer Neg Hx    Sleep apnea Neg Hx     Current Outpatient Medications:    atorvastatin (LIPITOR) 40 MG tablet, TAKE ONE TABLET BY MOUTH AT BEDTIME, Disp: 90 tablet, Rfl: 0   Blood Glucose Monitoring Suppl (BLOOD GLUCOSE MONITOR SYSTEM) w/Device KIT, 1 Device 2 (two) times daily by Does not apply route., Disp: 1 each, Rfl: 0   fexofenadine (ALLEGRA) 180 MG tablet, Take 180 mg by mouth daily., Disp: , Rfl:    glucose blood test strip, Use as instructed, Disp: 100 each, Rfl: 12    ibuprofen (ADVIL,MOTRIN) 200 MG tablet, Take 200 mg by mouth as needed for pain., Disp: , Rfl:    Lancets MISC, Use to test blood sugar twice daily., Disp: 100 each, Rfl: 3   medroxyPROGESTERone (PROVERA) 10 MG tablet, , Disp: , Rfl:    MOUNJARO 15 MG/0.5ML Pen, INJECT 15MG  INTO THE SKIN ONCE A WEEK, Disp: 6 mL, Rfl: 1   venlafaxine XR (EFFEXOR-XR) 75 MG 24 hr capsule, Take 150 mg by mouth. , Disp: , Rfl:    vitamin C (ASCORBIC ACID) 500 MG tablet, Take 500 mg by mouth daily., Disp: , Rfl:   No Known Allergies   ROS: Review of Systems {ros; complete:30496}    Physical exam {Exam, Complete:[id number]}      11/22/2022    7:58 AM 05/18/2022    8:02 AM 11/17/2021    8:17 AM  Depression screen PHQ 2/9  Decreased Interest 0 0 0  Down, Depressed, Hopeless 0 0 0  PHQ - 2 Score 0 0 0  Altered sleeping 0 0   Tired, decreased energy 0 0   Change in appetite 0 0   Feeling bad or failure about yourself  0 0   Trouble concentrating 0 0   Moving slowly or fidgety/restless 0 0   Suicidal thoughts 0 0   PHQ-9 Score 0 0   Difficult doing work/chores Not difficult at all Not difficult at all       11/22/2022    7:58 AM 05/18/2022    8:01 AM 11/17/2021    8:17 AM 07/15/2021    8:24 AM  GAD 7 : Generalized Anxiety Score  Nervous, Anxious, on Edge 0 0 0 0  Control/stop worrying 0 0 0 0  Worry too much - different things 0 0 0 0  Trouble relaxing 0 0 0 0  Restless 0 0 0 0  Easily annoyed or irritable 0 0 0 0  Afraid - awful might happen 0 0 0 0  Total GAD 7 Score 0 0 0 0  Anxiety Difficulty Not difficult at all Not difficult at all Not difficult at all Not difficult at all     Assessment/ Plan: Mary [name] here for annual physical exam.   Annual physical exam  Type 2 diabetes mellitus with other specified complication, without long-term current use of insulin (HCC)  Hyperlipidemia associated with type 2 diabetes mellitus (HCC)  Long-term current use of injectable noninsulin antidiabetic  medication  Morbid obesity (HCC)  BMI 36.0-36.9,adult  OSA on CPAP  ***  Counseled on healthy lifestyle choices, including diet (rich in fruits, vegetables and lean meats and low in salt and simple carbohydrates) and exercise (at least 30 minutes of moderate physical activity daily).  Patient to follow up ***  Mary [name] Alas M. Nadine Counts, DO

## 2023-05-30 ENCOUNTER — Encounter: Payer: 59 | Admitting: Family Medicine

## 2023-05-30 DIAGNOSIS — Z0000 Encounter for general adult medical examination without abnormal findings: Secondary | ICD-10-CM

## 2023-05-30 DIAGNOSIS — Z6836 Body mass index (BMI) 36.0-36.9, adult: Secondary | ICD-10-CM

## 2023-05-30 DIAGNOSIS — E1169 Type 2 diabetes mellitus with other specified complication: Secondary | ICD-10-CM

## 2023-05-30 DIAGNOSIS — Z7985 Long-term (current) use of injectable non-insulin antidiabetic drugs: Secondary | ICD-10-CM

## 2023-05-30 DIAGNOSIS — G4733 Obstructive sleep apnea (adult) (pediatric): Secondary | ICD-10-CM

## 2023-08-02 ENCOUNTER — Telehealth: Payer: Self-pay | Admitting: Family Medicine

## 2023-09-05 ENCOUNTER — Other Ambulatory Visit: Payer: Self-pay | Admitting: Obstetrics and Gynecology

## 2023-09-05 DIAGNOSIS — Z1231 Encounter for screening mammogram for malignant neoplasm of breast: Secondary | ICD-10-CM

## 2023-09-08 ENCOUNTER — Other Ambulatory Visit: Payer: Self-pay

## 2023-09-08 DIAGNOSIS — E1169 Type 2 diabetes mellitus with other specified complication: Secondary | ICD-10-CM

## 2023-10-10 ENCOUNTER — Other Ambulatory Visit: Payer: Self-pay | Admitting: Family Medicine

## 2023-10-10 DIAGNOSIS — E1169 Type 2 diabetes mellitus with other specified complication: Secondary | ICD-10-CM

## 2023-10-19 ENCOUNTER — Ambulatory Visit
Admission: RE | Admit: 2023-10-19 | Discharge: 2023-10-19 | Disposition: A | Discharge status: Home / Self Care | Source: Ambulatory Visit | Attending: Obstetrics and Gynecology | Admitting: Obstetrics and Gynecology

## 2023-10-19 DIAGNOSIS — Z1231 Encounter for screening mammogram for malignant neoplasm of breast: Secondary | ICD-10-CM

## 2023-10-28 ENCOUNTER — Telehealth: Payer: Self-pay | Admitting: Neurology

## 2023-10-28 NOTE — Telephone Encounter (Signed)
 R/s due to conflict

## 2023-11-04 ENCOUNTER — Ambulatory Visit: Payer: 59 | Admitting: Adult Health

## 2024-01-02 ENCOUNTER — Other Ambulatory Visit: Payer: Self-pay | Admitting: Family Medicine

## 2024-01-02 DIAGNOSIS — E1169 Type 2 diabetes mellitus with other specified complication: Secondary | ICD-10-CM

## 2024-01-24 ENCOUNTER — Encounter: Payer: Self-pay | Admitting: Family Medicine

## 2024-01-24 ENCOUNTER — Ambulatory Visit: Payer: Self-pay | Admitting: Family Medicine

## 2024-01-24 VITALS — BP 120/75 | HR 73 | Temp 97.4°F | Ht 64.0 in | Wt 215.5 lb

## 2024-01-24 DIAGNOSIS — Z23 Encounter for immunization: Secondary | ICD-10-CM

## 2024-01-24 DIAGNOSIS — E66812 Obesity, class 2: Secondary | ICD-10-CM

## 2024-01-24 DIAGNOSIS — G4733 Obstructive sleep apnea (adult) (pediatric): Secondary | ICD-10-CM

## 2024-01-24 DIAGNOSIS — M25551 Pain in right hip: Secondary | ICD-10-CM

## 2024-01-24 DIAGNOSIS — E1169 Type 2 diabetes mellitus with other specified complication: Secondary | ICD-10-CM

## 2024-01-24 DIAGNOSIS — Z0001 Encounter for general adult medical examination with abnormal findings: Secondary | ICD-10-CM

## 2024-01-24 DIAGNOSIS — Z7985 Long-term (current) use of injectable non-insulin antidiabetic drugs: Secondary | ICD-10-CM | POA: Diagnosis not present

## 2024-01-24 DIAGNOSIS — M25552 Pain in left hip: Secondary | ICD-10-CM

## 2024-01-24 DIAGNOSIS — Z0000 Encounter for general adult medical examination without abnormal findings: Secondary | ICD-10-CM

## 2024-01-24 DIAGNOSIS — E785 Hyperlipidemia, unspecified: Secondary | ICD-10-CM

## 2024-01-24 DIAGNOSIS — Z6836 Body mass index (BMI) 36.0-36.9, adult: Secondary | ICD-10-CM

## 2024-01-24 LAB — BAYER DCA HB A1C WAIVED: HB A1C (BAYER DCA - WAIVED): 5.2 % (ref 4.8–5.6)

## 2024-01-24 MED ORDER — BLOOD GLUCOSE MONITORING SUPPL DEVI: 0 refills | Status: AC

## 2024-01-24 MED ORDER — BLOOD GLUCOSE TEST VI STRP: ORAL_STRIP | 3 refills | Status: AC

## 2024-01-24 MED ORDER — LANCET DEVICE MISC: 0 refills | Status: AC

## 2024-01-24 MED ORDER — ATORVASTATIN CALCIUM 40 MG PO TABS: 40.0000 mg | ORAL_TABLET | Freq: Every day | ORAL | 4 refills | Status: AC

## 2024-01-24 MED ORDER — LANCETS MISC: 3 refills | Status: AC

## 2024-01-24 MED ORDER — MOUNJARO 15 MG/0.5ML ~~LOC~~ SOAJ: 15.0000 mg | SUBCUTANEOUS | 4 refills | Status: AC

## 2024-01-24 NOTE — Progress Notes (Signed)
 Mary [name] is a 58 y.o. female presents to office today for annual physical exam examination.     Type 2 Diabetes with hyperlipidemia associated with morbid obesity/ OSA:  She is compliant with Mounjaro  15 mg weekly, Lipitor 40 mg daily.  Needs new glucose monitoring kit.  She does report that she stopped Lipitor for a while until about 5 weeks ago because it was causing her quite a bit of myalgia.  Last eye exam: needs Last foot exam: needs Last A1c:  Lab Results  Component Value Date   HGBA1C 5.3 11/22/2022   Nephropathy screen indicated?: needs Last flu, zoster and/or pneumovax:  Immunization History  Administered Date(s) Administered   Influenza, Seasonal, Injecte, Preservative Fre 01/24/2024   Influenza,inj,Quad PF,6+ Mos 12/04/2018   Influenza-Unspecified 12/07/2017   Moderna Sars-Covid-2 Vaccination 05/30/2019, 07/05/2019, 01/25/2020   PNEUMOCOCCAL CONJUGATE-20 01/24/2024   Pneumococcal Polysaccharide-23 03/11/2020   Tdap 08/30/2017   Zoster Recombinant(Shingrix ) 07/15/2021, 11/17/2021    ROS: Denies dizziness, LOC, polyuria, polydipsia, unintended weight loss/gain, foot ulcerations, numbness or tingling in extremities, shortness of breath or chest pain.  She does report some bilateral hip pain however that seems to be worse with going up and down stairs and or laying on that side.  She was wondering if maybe it was related to her going through menopause as she read something about gluteal tendinopathy related to menopause but points to the lateral aspect of her hip as the area of discomfort and sometimes radiates down the lateral thigh  Health Maintenance Due  Topic Date Due   HIV Screening  Never done   Hepatitis B Vaccines 19-59 Average Risk (1 of 3 - 19+ 3-dose series) Never done   Diabetic kidney evaluation - eGFR measurement  05/18/2023   Diabetic kidney evaluation - Urine ACR  05/18/2023   HEMOGLOBIN A1C  05/22/2023   COVID-19 Vaccine (4 - 2025-26 season)  11/14/2023   FOOT EXAM  11/22/2023   OPHTHALMOLOGY EXAM  11/22/2023    Immunization History  Administered Date(s) Administered   Influenza, Seasonal, Injecte, Preservative Fre 01/24/2024   Influenza,inj,Quad PF,6+ Mos 12/04/2018   Influenza-Unspecified 12/07/2017   Moderna Sars-Covid-2 Vaccination 05/30/2019, 07/05/2019, 01/25/2020   PNEUMOCOCCAL CONJUGATE-20 01/24/2024   Pneumococcal Polysaccharide-23 03/11/2020   Tdap 08/30/2017   Zoster Recombinant(Shingrix ) 07/15/2021, 11/17/2021   Past Medical History:  Diagnosis Date   Allergy    seasonal   Anemia    Anxiety    Arthritis    Diabetes mellitus without complication (HCC)    GERD (gastroesophageal reflux disease)    Hypersomnia, persistent 07/11/2020   Mononucleosis    Seasonal allergies    Sleep apnea    has not gotten c-pap yet   Viral hepatitis 10/2016   Social History   Socioeconomic History   Marital status: Married    Spouse name: Not on file   Number of children: Not on file   Years of education: Not on file   Highest education level: Not on file  Occupational History   Not on file  Tobacco Use   Smoking status: Never   Smokeless tobacco: Never  Vaping Use   Vaping status: Never Used  Substance and Sexual Activity   Alcohol use: Yes    Comment: occasional   Drug use: No   Sexual activity: Yes  Other Topics Concern   Not on file  Social History Narrative   Not on file   Social Drivers of Health   Financial Resource Strain: Not on file  Food Insecurity: Not on file  Transportation Needs: Not on file  Physical Activity: Not on file  Stress: Not on file  Social Connections: Not on file  Intimate Partner Violence: Not on file   Past Surgical History:  Procedure Laterality Date   CARPAL TUNNEL RELEASE Left 12/05/2012   Procedure: CARPAL TUNNEL RELEASE;  Surgeon: Lemond Stable, MD;  Location: [facility];  Service: Orthopedics;  Laterality: Left;   COLONOSCOPY     TUBAL LIGATION  2004   Family  History  Problem Relation Age of Onset   Cancer Mother 91       colon   Breast cancer Sister    Colon polyps Sister    Diabetes Brother    Heart disease Paternal Grandmother    Colon cancer Neg Hx    Esophageal cancer Neg Hx    Rectal cancer Neg Hx    Stomach cancer Neg Hx    Sleep apnea Neg Hx     Current Outpatient Medications:    Blood Glucose Monitoring Suppl DEVI, Check BGs daily. E11.69. Dispense based on patient and insurance preference., Disp: 1 each, Rfl: 0   fexofenadine (ALLEGRA) 180 MG tablet, Take 180 mg by mouth daily., Disp: , Rfl:    Glucose Blood (BLOOD GLUCOSE TEST STRIPS) STRP, Check BGs daily. E11.69. Dispense based on patient and insurance preference., Disp: 100 strip, Rfl: 3   ibuprofen (ADVIL,MOTRIN) 200 MG tablet, Take 200 mg by mouth as needed for pain., Disp: , Rfl:    Lancet Device MISC, Check BGs daily. E11.69. Dispense based on patient and insurance preference., Disp: 1 each, Rfl: 0   Lancets MISC, Check BGs daily. E11.69. Dispense based on patient and insurance preference., Disp: 100 each, Rfl: 3   venlafaxine XR (EFFEXOR-XR) 75 MG 24 hr capsule, Take 150 mg by mouth. , Disp: , Rfl:    vitamin C (ASCORBIC ACID) 500 MG tablet, Take 500 mg by mouth daily., Disp: , Rfl:    atorvastatin  (LIPITOR) 40 MG tablet, Take 1 tablet (40 mg total) by mouth at bedtime., Disp: 90 tablet, Rfl: 4   tirzepatide  (MOUNJARO ) 15 MG/0.5ML Pen, Inject 15 mg into the skin once a week., Disp: 6 mL, Rfl: 4  No Known Allergies   ROS: Review of Systems Pertinent items noted in HPI and remainder of comprehensive ROS otherwise negative.    Physical exam BP 120/75   Pulse 73   Temp (!) 97.4 F (36.3 C)   Ht 5' 4 (1.626 m)   Wt 215 lb 8 oz (97.8 kg)   LMP 06/16/2020   SpO2 97%   BMI 36.99 kg/m  General appearance: alert, cooperative, appears stated age, no distress, and moderately obese Head: Normocephalic, without obvious abnormality, atraumatic Eyes: negative findings:  lids and lashes normal, conjunctivae and sclerae normal, corneas clear, and pupils equal, round, reactive to light and accomodation Ears: Dried cerumen bilaterally.  Wears hearing aids Nose: Nares normal. Septum midline. Mucosa normal. No drainage or sinus tenderness. Throat: lips, mucosa, and tongue normal; teeth and gums normal Neck: no adenopathy, no carotid bruit, supple, symmetrical, trachea midline, and thyroid not enlarged, symmetric, no tenderness/mass/nodules Back: symmetric, no curvature. ROM normal. No CVA tenderness. Lungs: clear to auscultation bilaterally Heart: regular rate and rhythm, S1, S2 normal, no murmur, click, rub or gallop Abdomen: soft, non-tender; bowel sounds normal; no masses,  no organomegaly Extremities: extremities normal, atraumatic, no cyanosis or edema Pulses: 2+ and symmetric Skin: Skin color, texture, turgor normal. No rashes or lesions  Lymph nodes: No supraclavicular or anterior cervical lymph node enlargement Neurologic: Alert and oriented X 3, normal strength and tone. Normal symmetric reflexes. Normal coordination and gait.  Wears hearing aids   Diabetic Foot Exam - Simple   Simple Foot Form Diabetic Foot exam was performed with the following findings: Yes 01/24/2024 12:12 PM  Visual Inspection No deformities, no ulcerations, no other skin breakdown bilaterally: Yes Sensation Testing Intact to touch and monofilament testing bilaterally: Yes Pulse Check Posterior Tibialis and Dorsalis pulse intact bilaterally: Yes Comments     No results found for this or any previous visit (from the past 2160 hours).   Assessment/ Plan: Sari KANDICE Rummer here for annual physical exam.   Annual physical exam  Type 2 diabetes mellitus with other specified complication, without long-term current use of insulin (HCC) - Plan: CMP14+EGFR, Bayer DCA Hb A1c Waived, CBC with Differential, Microalbumin/Creatinine Ratio, Urine, tirzepatide  (MOUNJARO ) 15 MG/0.5ML Pen,  Blood Glucose Monitoring Suppl DEVI, Glucose Blood (BLOOD GLUCOSE TEST STRIPS) STRP, Lancet Device MISC, Lancets MISC  Hyperlipidemia associated with type 2 diabetes mellitus (HCC) - Plan: CMP14+EGFR, Lipid Panel, TSH, atorvastatin  (LIPITOR) 40 MG tablet, tirzepatide  (MOUNJARO ) 15 MG/0.5ML Pen  Long-term current use of injectable noninsulin antidiabetic medication  Morbid obesity (HCC) - Plan: CMP14+EGFR, VITAMIN D 25 Hydroxy (Vit-D Deficiency, Fractures), tirzepatide  (MOUNJARO ) 15 MG/0.5ML Pen  OSA on CPAP - Plan: CMP14+EGFR, CBC with Differential, tirzepatide  (MOUNJARO ) 15 MG/0.5ML Pen  Class 2 severe obesity due to excess calories with serious comorbidity and body mass index (BMI) of 36.0 to 36.9 in adult - Plan: CMP14+EGFR  Bilateral hip pain  Immunization due - Plan: Pneumococcal conjugate vaccine 20-valent (Prevnar 20)  Encounter for immunization - Plan: Flu vaccine trivalent PF, 6mos and older(Flulaval,Afluria,Fluarix,Fluzone)   Influenza vaccination and pneumococcal vaccination administered today.  Diabetic foot exam performed.  She will schedule diabetic eye exam.  Fasting labs collected.  New glucose meter and testing supplies sent.  Having some myalgia with use of statin and had lapsed in medication.  Discussed co-Q10 and if this does not improve symptoms over the next month then we can consider Repatha as an alternative for Cholesterol management  Check vitamin D given history of morbid obesity and CBC given history of OSA on CPAP  Suspect trochanteric bursitis of the hips.  We discussed use of NSAIDs, ice and stretching to alleviate discomfort.  However, if symptoms are persistent, we can certainly consider corticosteroid injection and she will contact me via MyChart if this is needed  Counseled on healthy lifestyle choices, including diet (rich in fruits, vegetables and lean meats and low in salt and simple carbohydrates) and exercise (at least 30 minutes of moderate physical  activity daily).  Patient to follow up 48m for DM  Pasqualino Witherspoon M. Jolinda, DO

## 2024-01-25 ENCOUNTER — Ambulatory Visit: Payer: Self-pay | Admitting: Family Medicine

## 2024-01-25 DIAGNOSIS — G720 Drug-induced myopathy: Secondary | ICD-10-CM

## 2024-01-25 DIAGNOSIS — E1169 Type 2 diabetes mellitus with other specified complication: Secondary | ICD-10-CM

## 2024-01-25 DIAGNOSIS — D509 Iron deficiency anemia, unspecified: Secondary | ICD-10-CM

## 2024-01-25 LAB — CBC WITH DIFFERENTIAL/PLATELET
Basophils Absolute: 0.1 x10E3/uL (ref 0.0–0.2)
Basos: 1 %
EOS (ABSOLUTE): 0.2 x10E3/uL (ref 0.0–0.4)
Eos: 3 %
Hematocrit: 37.9 % (ref 34.0–46.6)
Hemoglobin: 11 g/dL — ABNORMAL LOW (ref 11.1–15.9)
Immature Grans (Abs): 0 x10E3/uL (ref 0.0–0.1)
Immature Granulocytes: 0 %
Lymphocytes Absolute: 1.9 x10E3/uL (ref 0.7–3.1)
Lymphs: 26 %
MCH: 22.7 pg — ABNORMAL LOW (ref 26.6–33.0)
MCHC: 29 g/dL — ABNORMAL LOW (ref 31.5–35.7)
MCV: 78 fL — ABNORMAL LOW (ref 79–97)
Monocytes Absolute: 0.5 x10E3/uL (ref 0.1–0.9)
Monocytes: 7 %
Neutrophils Absolute: 4.5 x10E3/uL (ref 1.4–7.0)
Neutrophils: 63 %
Platelets: 311 x10E3/uL (ref 150–450)
RBC: 4.84 x10E6/uL (ref 3.77–5.28)
RDW: 16.3 % — ABNORMAL HIGH (ref 11.7–15.4)
WBC: 7.2 x10E3/uL (ref 3.4–10.8)

## 2024-01-25 LAB — CMP14+EGFR
ALT: 15 IU/L (ref 0–32)
AST: 19 IU/L (ref 0–40)
Albumin: 4.2 g/dL (ref 3.8–4.9)
Alkaline Phosphatase: 93 IU/L (ref 49–135)
BUN/Creatinine Ratio: 21 (ref 9–23)
BUN: 16 mg/dL (ref 6–24)
Bilirubin Total: 0.5 mg/dL (ref 0.0–1.2)
CO2: 24 mmol/L (ref 20–29)
Calcium: 9.2 mg/dL (ref 8.7–10.2)
Chloride: 106 mmol/L (ref 96–106)
Creatinine, Ser: 0.78 mg/dL (ref 0.57–1.00)
Globulin, Total: 2.8 g/dL (ref 1.5–4.5)
Glucose: 90 mg/dL (ref 70–99)
Potassium: 4.7 mmol/L (ref 3.5–5.2)
Sodium: 142 mmol/L (ref 134–144)
Total Protein: 7 g/dL (ref 6.0–8.5)
eGFR: 89 mL/min/1.73 (ref >59)

## 2024-01-25 LAB — LIPID PANEL
Chol/HDL Ratio: 2 ratio (ref 0.0–4.4)
Cholesterol, Total: 121 mg/dL (ref 100–199)
HDL: 61 mg/dL (ref >39)
LDL Chol Calc (NIH): 45 mg/dL (ref 0–99)
Triglycerides: 73 mg/dL (ref 0–149)
VLDL Cholesterol Cal: 15 mg/dL (ref 5–40)

## 2024-01-25 LAB — MICROALBUMIN / CREATININE URINE RATIO
Creatinine, Urine: 200.9 mg/dL
Microalb/Creat Ratio: 5 mg/g{creat} (ref 0–29)
Microalbumin, Urine: 9.8 ug/mL

## 2024-01-25 LAB — TSH: TSH: 1.75 u[IU]/mL (ref 0.450–4.500)

## 2024-01-25 LAB — VITAMIN D 25 HYDROXY (VIT D DEFICIENCY, FRACTURES): Vit D, 25-Hydroxy: 29.4 ng/mL — AB (ref 30.0–100.0)

## 2024-02-01 MED ORDER — REPATHA SURECLICK 140 MG/ML ~~LOC~~ SOAJ: 140.0000 mg | SUBCUTANEOUS | 3 refills | Status: AC

## 2024-02-21 ENCOUNTER — Other Ambulatory Visit (HOSPITAL_COMMUNITY): Payer: Self-pay

## 2024-03-23 ENCOUNTER — Ambulatory Visit: Admitting: Family Medicine

## 2024-03-23 ENCOUNTER — Encounter: Payer: Self-pay | Admitting: Family Medicine

## 2024-03-23 ENCOUNTER — Telehealth: Payer: Self-pay | Admitting: Family Medicine

## 2024-03-23 VITALS — BP 102/68 | HR 81 | Temp 97.9°F | Ht 64.0 in | Wt 215.0 lb

## 2024-03-23 DIAGNOSIS — J0100 Acute maxillary sinusitis, unspecified: Secondary | ICD-10-CM

## 2024-03-23 MED ORDER — AMOXICILLIN-POT CLAVULANATE 875-125 MG PO TABS: 1.0000 | ORAL_TABLET | Freq: Two times a day (BID) | ORAL | 0 refills | Status: AC

## 2024-03-23 NOTE — Telephone Encounter (Signed)
 LMTCB to schedule appt for labs for next week

## 2024-03-23 NOTE — Progress Notes (Signed)
 "  BP 102/68   Pulse 81   Temp 97.9 F (36.6 C)   Ht 5' 4 (1.626 m)   Wt 215 lb (97.5 kg)   LMP 06/16/2020   SpO2 96%   BMI 36.90 kg/m    Subjective:   Patient ID: Mary [name], female    DOB: 25-Sep-1965, 59 y.o.   MRN: [id number]  HPI: Mary [name] is a 59 y.o. female presenting on 03/23/2024 for Sinus Problem and Ear Pain (Left sided)   Discussed the use of AI scribe software for clinical note transcription with the patient, who gave verbal consent to proceed.  History of Present Illness   Mary [name] is a 59 year old female who presents with sinus pressure and lymph node tenderness.  Sinus pressure and nasal congestion - Intermittent sinus pressure since Thanksgiving - Nasal congestion and sneezing developed two days ago, improved today - Persistent pressure, especially when lying on her side - Significant postnasal drainage  Lymphadenopathy - Tenderness in lymph nodes on the left side, particularly under the ear, first noticed a week ago - Previous episode of lymph node tenderness before Christmas, more pronounced in a different location  Otolaryngologic symptoms - Occasional ear pain and itching - Increased ear itching since starting hearing aids - Congestion and sensation of a lump in the throat, leading to frequent throat clearing  Systemic symptoms - No fever, chills, or body aches  Symptom management - Using Sudafed and Zyrtec, suspecting allergies - Alka-Seltzer taken for cold symptoms - Medications provide some relief, but symptoms persist          Relevant past medical, surgical, family and social history reviewed and updated as indicated. Interim medical history since our last visit reviewed. Allergies and medications reviewed and updated.  Review of Systems  Constitutional:  Negative for chills and fever.  HENT:  Positive for congestion.   Eyes:  Negative for visual disturbance.  Respiratory:  Positive for cough. Negative for chest  tightness and shortness of breath.   Cardiovascular:  Negative for chest pain and leg swelling.  Musculoskeletal:  Negative for back pain and gait problem.  Skin:  Negative for rash.  Neurological:  Negative for light-headedness and headaches.  Psychiatric/Behavioral:  Negative for agitation and behavioral problems.   All other systems reviewed and are negative.   Per HPI unless specifically indicated above   Allergies as of 03/23/2024   No Known Allergies      Medication List        Accurate as of March 23, 2024  8:28 AM. If you have any questions, ask your nurse or doctor.          STOP taking these medications    fexofenadine 180 MG tablet Commonly known as: ALLEGRA Stopped by: Fonda Levins, MD       TAKE these medications    amoxicillin -clavulanate 875-125 MG tablet Commonly known as: AUGMENTIN  Take 1 tablet by mouth 2 (two) times daily. Started by: Fonda Levins, MD   ascorbic acid 500 MG tablet Commonly known as: VITAMIN C Take 500 mg by mouth daily.   atorvastatin  40 MG tablet Commonly known as: LIPITOR Take 1 tablet (40 mg total) by mouth at bedtime.   Blood Glucose Monitoring Suppl Devi Check BGs daily. E11.69. Dispense based on patient and insurance preference.   BLOOD GLUCOSE TEST STRIPS Strp Check BGs daily. E11.69. Dispense based on patient and insurance preference.   cetirizine 10 MG chewable tablet Commonly known as: ZYRTEC  Chew 10 mg by mouth daily.   ibuprofen 200 MG tablet Commonly known as: ADVIL Take 200 mg by mouth as needed for pain.   Lancet Device Misc Check BGs daily. E11.69. Dispense based on patient and insurance preference.   Lancets Misc Check BGs daily. E11.69. Dispense based on patient and insurance preference.   Mounjaro  15 MG/0.5ML Pen Generic drug: tirzepatide  Inject 15 mg into the skin once a week.   Repatha  SureClick 140 MG/ML Soaj Generic drug: Evolocumab  Inject 140 mg into the skin every 14  (fourteen) days.   venlafaxine XR 75 MG 24 hr capsule Commonly known as: EFFEXOR-XR Take 150 mg by mouth.         Objective:   BP 102/68   Pulse 81   Temp 97.9 F (36.6 C)   Ht 5' 4 (1.626 m)   Wt 215 lb (97.5 kg)   LMP 06/16/2020   SpO2 96%   BMI 36.90 kg/m   Wt Readings from Last 3 Encounters:  03/23/24 215 lb (97.5 kg)  01/24/24 215 lb 8 oz (97.8 kg)  11/22/22 215 lb (97.5 kg)    Physical Exam Physical Exam   HEENT: Cerumen present, no infection in ears. NECK: Tender lymph nodes on left side of neck. CHEST: Lungs clear to auscultation.         Assessment & Plan:   Problem List Items Addressed This Visit   None Visit Diagnoses       Acute non-recurrent maxillary sinusitis    -  Primary   Relevant Medications   cetirizine (ZYRTEC) 10 MG chewable tablet   amoxicillin -clavulanate (AUGMENTIN ) 875-125 MG tablet          Acute sinusitis Intermittent sinus pressure and congestion with lymphadenopathy and ear pain suggestive of sinusitis. Considered infection or allergies as causes. No fever or systemic symptoms. Persistent symptoms warrant antibiotic therapy. - Prescribed amoxicillin  with food, twice daily for ten days. - Advised to report if symptoms do not improve or worsen.  Impacted cerumen Cerumen in both ears likely due to hearing aid use. Mild itching and discomfort present. - Recommended ear wax drops for cerumen buildup. - Suggested mineral oil or sweet oil drops for itching relief.          Follow up plan: No follow-ups on file.  Counseling provided for all of the vaccine components No orders of the defined types were placed in this encounter.   Fonda Levins, MD [facility] 03/23/2024, 8:28 AM     "

## 2024-03-23 NOTE — Telephone Encounter (Signed)
 Patient wants to know when she needs to come and get her iron checked. Please call and advise.

## 2024-03-23 NOTE — Telephone Encounter (Signed)
 Per PCP result notes from 01/24/2024, PCP wanted patient to have labs rechecked in about 2 months, so around 03/25/2024. Please call patient and see if she is can in for labs next week and make lab appt.

## 2024-07-09 ENCOUNTER — Ambulatory Visit: Admitting: Adult Health

## 2024-08-03 ENCOUNTER — Ambulatory Visit: Admitting: Family Medicine

## 2025-01-25 ENCOUNTER — Encounter: Admitting: Family Medicine
